# Patient Record
Sex: Male | Born: 1960 | Race: White | Hispanic: No | Marital: Married | Smoking: Former smoker
Health system: Southern US, Community
[De-identification: ages and names within clinical notes are randomized; demographics above are authoritative.]

## PROBLEM LIST (undated history)

## (undated) DIAGNOSIS — I10 Essential (primary) hypertension: Secondary | ICD-10-CM

## (undated) DIAGNOSIS — E119 Type 2 diabetes mellitus without complications: Secondary | ICD-10-CM

## (undated) DIAGNOSIS — E785 Hyperlipidemia, unspecified: Secondary | ICD-10-CM

## (undated) DIAGNOSIS — K219 Gastro-esophageal reflux disease without esophagitis: Secondary | ICD-10-CM

## (undated) HISTORY — PX: NO PAST SURGERIES: SHX2092

## (undated) HISTORY — DX: Type 2 diabetes mellitus without complications: E11.9

## (undated) HISTORY — DX: Gastro-esophageal reflux disease without esophagitis: K21.9

## (undated) HISTORY — DX: Essential (primary) hypertension: I10

## (undated) HISTORY — PX: COLONOSCOPY: SHX174

## (undated) HISTORY — DX: Hyperlipidemia, unspecified: E78.5

---

## 2005-05-21 ENCOUNTER — Ambulatory Visit: Payer: Self-pay | Admitting: Family Medicine

## 2005-08-02 ENCOUNTER — Ambulatory Visit: Payer: Self-pay | Admitting: Family Medicine

## 2007-02-17 ENCOUNTER — Ambulatory Visit: Payer: Self-pay | Admitting: Family Medicine

## 2007-03-30 ENCOUNTER — Ambulatory Visit: Payer: Self-pay | Admitting: Family Medicine

## 2007-03-30 LAB — CONVERTED CEMR LAB
AST: 21 units/L (ref 0–37)
Albumin: 4 g/dL (ref 3.5–5.2)
Alkaline Phosphatase: 54 units/L (ref 39–117)
BUN: 9 mg/dL (ref 6–23)
Basophils Absolute: 0.1 10*3/uL (ref 0.0–0.1)
Blood in Urine, dipstick: NEGATIVE
Chloride: 105 meq/L (ref 96–112)
Cholesterol: 172 mg/dL (ref 0–200)
Direct LDL: 74.6 mg/dL
Eosinophils Absolute: 0.2 10*3/uL (ref 0.0–0.6)
GFR calc non Af Amer: 96 mL/min
HCT: 42.6 % (ref 39.0–52.0)
HDL: 30.8 mg/dL — ABNORMAL LOW (ref >39.0)
Ketones, urine, test strip: NEGATIVE
MCHC: 34.2 g/dL (ref 30.0–36.0)
MCV: 93.1 fL (ref 78.0–100.0)
Monocytes Relative: 11.2 % — ABNORMAL HIGH (ref 3.0–11.0)
Neutrophils Relative %: 60.7 % (ref 43.0–77.0)
Platelets: 185 10*3/uL (ref 150–400)
RBC: 4.58 M/uL (ref 4.22–5.81)
Sodium: 145 meq/L (ref 135–145)
TSH: 1.48 microintl units/mL (ref 0.35–5.50)
Total CHOL/HDL Ratio: 5.6
Triglycerides: 366 mg/dL (ref 0–149)

## 2007-04-06 ENCOUNTER — Ambulatory Visit: Payer: Self-pay | Admitting: Family Medicine

## 2007-04-06 DIAGNOSIS — E781 Pure hyperglyceridemia: Secondary | ICD-10-CM | POA: Insufficient documentation

## 2007-07-08 ENCOUNTER — Ambulatory Visit: Payer: Self-pay | Admitting: Family Medicine

## 2007-07-08 LAB — CONVERTED CEMR LAB
Direct LDL: 121.3 mg/dL
HDL: 26.9 mg/dL — ABNORMAL LOW (ref >39.0)
Triglycerides: 446 mg/dL (ref 0–149)

## 2007-07-13 ENCOUNTER — Telehealth: Payer: Self-pay | Admitting: Family Medicine

## 2007-07-14 ENCOUNTER — Telehealth: Payer: Self-pay | Admitting: Family Medicine

## 2007-07-21 ENCOUNTER — Telehealth: Payer: Self-pay | Admitting: Family Medicine

## 2008-11-10 ENCOUNTER — Telehealth: Payer: Self-pay | Admitting: Family Medicine

## 2008-11-18 ENCOUNTER — Ambulatory Visit: Payer: Self-pay | Admitting: Family Medicine

## 2008-11-18 DIAGNOSIS — R030 Elevated blood-pressure reading, without diagnosis of hypertension: Secondary | ICD-10-CM | POA: Insufficient documentation

## 2008-11-18 LAB — CONVERTED CEMR LAB
ALT: 34 units/L (ref 0–53)
AST: 25 units/L (ref 0–37)
Blood in Urine, dipstick: NEGATIVE
Eosinophils Relative: 1.8 % (ref 0.0–5.0)
GFR calc non Af Amer: 108.83 mL/min (ref >60)
HCT: 46.1 % (ref 39.0–52.0)
HDL: 34.6 mg/dL — ABNORMAL LOW (ref >39.00)
Hemoglobin: 16 g/dL (ref 13.0–17.0)
Lymphs Abs: 2 10*3/uL (ref 0.7–4.0)
Monocytes Relative: 7.3 % (ref 3.0–12.0)
Neutro Abs: 4.2 10*3/uL (ref 1.4–7.7)
Nitrite: NEGATIVE
Potassium: 3.9 meq/L (ref 3.5–5.1)
Protein, U semiquant: NEGATIVE
Sodium: 143 meq/L (ref 135–145)
TSH: 2.17 microintl units/mL (ref 0.35–5.50)
Total Bilirubin: 0.9 mg/dL (ref 0.3–1.2)
VLDL: 103.6 mg/dL — ABNORMAL HIGH (ref 0.0–40.0)
WBC Urine, dipstick: NEGATIVE
WBC: 6.8 10*3/uL (ref 4.5–10.5)

## 2009-01-03 ENCOUNTER — Ambulatory Visit: Payer: Self-pay | Admitting: Family Medicine

## 2009-01-03 DIAGNOSIS — I10 Essential (primary) hypertension: Secondary | ICD-10-CM | POA: Insufficient documentation

## 2009-01-03 HISTORY — DX: Essential (primary) hypertension: I10

## 2009-01-18 ENCOUNTER — Ambulatory Visit: Payer: Self-pay | Admitting: Family Medicine

## 2009-01-19 LAB — CONVERTED CEMR LAB
BUN: 13 mg/dL (ref 6–23)
Chloride: 110 meq/L (ref 96–112)
Creatinine, Ser: 1.1 mg/dL (ref 0.4–1.5)
Glucose, Bld: 107 mg/dL — ABNORMAL HIGH (ref 70–99)

## 2009-02-15 ENCOUNTER — Ambulatory Visit: Payer: Self-pay | Admitting: Family Medicine

## 2009-02-15 LAB — CONVERTED CEMR LAB
Alkaline Phosphatase: 32 units/L — ABNORMAL LOW (ref 39–117)
Bilirubin, Direct: 0.1 mg/dL (ref 0.0–0.3)
Cholesterol: 186 mg/dL (ref 0–200)
LDL Cholesterol: 112 mg/dL — ABNORMAL HIGH (ref 0–99)
Total Bilirubin: 1.1 mg/dL (ref 0.3–1.2)
Total CHOL/HDL Ratio: 5
Total Protein: 7 g/dL (ref 6.0–8.3)

## 2009-02-22 ENCOUNTER — Ambulatory Visit: Payer: Self-pay | Admitting: Family Medicine

## 2010-12-14 ENCOUNTER — Other Ambulatory Visit: Payer: Self-pay | Admitting: Family Medicine

## 2011-03-21 ENCOUNTER — Other Ambulatory Visit (INDEPENDENT_AMBULATORY_CARE_PROVIDER_SITE_OTHER): Payer: BC Managed Care – PPO

## 2011-03-21 DIAGNOSIS — Z0000 Encounter for general adult medical examination without abnormal findings: Secondary | ICD-10-CM

## 2011-03-21 LAB — BASIC METABOLIC PANEL
CO2: 28 mEq/L (ref 19–32)
Calcium: 9.5 mg/dL (ref 8.4–10.5)
Chloride: 100 mEq/L (ref 96–112)
Potassium: 4.5 mEq/L (ref 3.5–5.1)
Sodium: 138 mEq/L (ref 135–145)

## 2011-03-21 LAB — CBC WITH DIFFERENTIAL/PLATELET
Basophils Relative: 0.7 % (ref 0.0–3.0)
Eosinophils Absolute: 0.2 10*3/uL (ref 0.0–0.7)
Hemoglobin: 15.7 g/dL (ref 13.0–17.0)
Lymphocytes Relative: 27.1 % (ref 12.0–46.0)
MCHC: 33.9 g/dL (ref 30.0–36.0)
Neutro Abs: 5.1 10*3/uL (ref 1.4–7.7)
RBC: 4.97 Mil/uL (ref 4.22–5.81)

## 2011-03-21 LAB — PSA: PSA: 0.31 ng/mL (ref 0.10–4.00)

## 2011-03-21 LAB — HEPATIC FUNCTION PANEL
ALT: 22 U/L (ref 0–53)
Bilirubin, Direct: 0.1 mg/dL (ref 0.0–0.3)
Total Bilirubin: 0.6 mg/dL (ref 0.3–1.2)

## 2011-03-21 LAB — POCT URINALYSIS DIPSTICK
Bilirubin, UA: NEGATIVE
Glucose, UA: NEGATIVE
Leukocytes, UA: NEGATIVE
Nitrite, UA: NEGATIVE

## 2011-03-21 LAB — LDL CHOLESTEROL, DIRECT: Direct LDL: 73.5 mg/dL

## 2011-03-21 LAB — LIPID PANEL
HDL: 34.1 mg/dL — ABNORMAL LOW (ref >39.00)
VLDL: 200.2 mg/dL — ABNORMAL HIGH (ref 0.0–40.0)

## 2011-04-16 ENCOUNTER — Ambulatory Visit (INDEPENDENT_AMBULATORY_CARE_PROVIDER_SITE_OTHER): Payer: BC Managed Care – PPO | Admitting: Family Medicine

## 2011-04-16 ENCOUNTER — Encounter: Payer: Self-pay | Admitting: Family Medicine

## 2011-04-16 DIAGNOSIS — E785 Hyperlipidemia, unspecified: Secondary | ICD-10-CM

## 2011-04-16 DIAGNOSIS — R030 Elevated blood-pressure reading, without diagnosis of hypertension: Secondary | ICD-10-CM

## 2011-04-16 DIAGNOSIS — IMO0002 Reserved for concepts with insufficient information to code with codable children: Secondary | ICD-10-CM

## 2011-04-16 DIAGNOSIS — N529 Male erectile dysfunction, unspecified: Secondary | ICD-10-CM

## 2011-04-16 DIAGNOSIS — Z0000 Encounter for general adult medical examination without abnormal findings: Secondary | ICD-10-CM

## 2011-04-16 DIAGNOSIS — E781 Pure hyperglyceridemia: Secondary | ICD-10-CM

## 2011-04-16 LAB — TESTOSTERONE: Testosterone: 215.14 ng/dL — ABNORMAL LOW (ref 350.00–890.00)

## 2011-04-16 MED ORDER — LISINOPRIL 5 MG PO TABS: 5.0000 mg | ORAL_TABLET | Freq: Every day | ORAL | Status: DC

## 2011-04-16 MED ORDER — FENOFIBRATE 145 MG PO TABS: 145.0000 mg | ORAL_TABLET | Freq: Every day | ORAL | Status: DC

## 2011-04-16 MED ORDER — SILDENAFIL CITRATE 50 MG PO TABS: 50.0000 mg | ORAL_TABLET | ORAL | Status: DC | PRN

## 2011-04-16 MED ORDER — SIMVASTATIN 40 MG PO TABS: 40.0000 mg | ORAL_TABLET | Freq: Every day | ORAL | Status: DC

## 2011-04-16 NOTE — Patient Instructions (Signed)
Restart all your medications.  Return in two months for follow-up, office visit fasting labs one week prior.  Begin Viagra 50 mg dose one half tablet one hour prior to sex.  I will call you report of your testosterone level

## 2011-04-16 NOTE — Progress Notes (Signed)
  Subjective:    Patient ID: Seth Pineda, male    DOB: Jan 19, 1961, 50 y.o.   MRN: [id number]  HPI Seth Pineda is a delightful, 50 year old, married male, nonsmoker, who works in [employer],,,,,,,, 52 year old son,,,,,,,,, only child,,,,,,,,, who comes in today for general physical examination because of history of hypertriglyceridemia and mild hypertension.  He was on a combination of an 81-mg baby aspirin, Tri-Chlor 145 mg daily omega-3 fish oil, 500 mg of Niaspan and 40 mg of simvastatin.  This controlled his to hypertriglyceridemia.  He stopped his medication in January 2012 and now lipids.  Her back up to where they were before.  Total cholesterol to 239, triglyceride level of 2000.  He is asymptomatic.  He also takes lisinopril 5 mg daily for mild, hypertension.  He's also been off of that for about 6 months.  BP 124/90.  Review of systems otherwise negative except he is now over the last year developed some symptoms of erectile dysfunction.     Review of Systems  Constitutional: Negative.   HENT: Negative.   Eyes: Negative.   Respiratory: Negative.   Cardiovascular: Negative.   Gastrointestinal: Negative.   Genitourinary: Negative.   Musculoskeletal: Negative.   Skin: Negative.   Neurological: Negative.   Hematological: Negative.   Psychiatric/Behavioral: Negative.        Objective:   Physical Exam  Constitutional: He is oriented to person, place, and time. He appears well-developed and well-nourished.  HENT:  Head: Normocephalic and atraumatic.  Right Ear: External ear normal.  Left Ear: External ear normal.  Nose: Nose normal.  Mouth/Throat: Oropharynx is clear and moist.  Eyes: Conjunctivae and EOM are normal. Pupils are equal, round, and reactive to light.  Neck: Normal range of motion. Neck supple. No JVD present. No tracheal deviation present. No thyromegaly present.  Cardiovascular: Normal rate, regular rhythm, normal heart sounds and intact distal  pulses.  Exam reveals no gallop and no friction rub.   No murmur heard. Pulmonary/Chest: Effort normal and breath sounds normal. No stridor. No respiratory distress. He has no wheezes. He has no rales. He exhibits no tenderness.  Abdominal: Soft. Bowel sounds are normal. He exhibits no distension and no mass. There is no tenderness. There is no rebound and no guarding.  Genitourinary: Rectum normal, prostate normal and penis normal. Guaiac negative stool. No penile tenderness.  Musculoskeletal: Normal range of motion. He exhibits no edema and no tenderness.  Lymphadenopathy:    He has no cervical adenopathy.  Neurological: He is alert and oriented to person, place, and time. He has normal reflexes. No cranial nerve deficit. He exhibits normal muscle tone.  Skin: Skin is warm and dry. No rash noted. No erythema. No pallor.  Psychiatric: He has a normal mood and affect. His behavior is normal. Judgment and thought content normal.          Assessment & Plan:  Healthy male.  Hypertriglyceridemia.  Restart medication.  Hypertension.  Restart medication.  Erectile dysfunction.  Check testosterone level begin Viagra.  Follow-up on the above 3 problems in two months

## 2011-04-19 ENCOUNTER — Encounter: Payer: Self-pay | Admitting: Gastroenterology

## 2011-04-30 ENCOUNTER — Other Ambulatory Visit: Payer: Self-pay | Admitting: *Deleted

## 2011-04-30 MED ORDER — TESTOSTERONE 20.25 MG/ACT (1.62%) TD GEL: 2.0000 | Freq: Every day | TRANSDERMAL | Status: DC

## 2011-04-30 NOTE — Telephone Encounter (Signed)
Message copied by Trenton Gammon on Tue Apr 30, 2011 11:25 AM ------      Message from: TODD, JEFFREY A      Created: Wed Apr 17, 2011 11:04 AM       Testosterone level is low range of normal 350 to 890,,,,,,,,,,, is 215,,,,,,,,,, supplement would be...Marland KitchenMarland KitchenMarland Kitchen AndroGel 2 pounds daily.......... Okay to call in a two-month supply follow-up here in 6 weeks

## 2011-05-31 ENCOUNTER — Ambulatory Visit (AMBULATORY_SURGERY_CENTER): Payer: BC Managed Care – PPO | Admitting: *Deleted

## 2011-05-31 ENCOUNTER — Encounter: Payer: Self-pay | Admitting: Gastroenterology

## 2011-05-31 VITALS — Ht >= 80 in | Wt 220.9 lb

## 2011-05-31 DIAGNOSIS — Z1211 Encounter for screening for malignant neoplasm of colon: Secondary | ICD-10-CM

## 2011-05-31 MED ORDER — PEG-KCL-NACL-NASULF-NA ASC-C 100 G PO SOLR: ORAL | Status: DC

## 2011-06-11 ENCOUNTER — Other Ambulatory Visit (INDEPENDENT_AMBULATORY_CARE_PROVIDER_SITE_OTHER): Payer: BC Managed Care – PPO

## 2011-06-11 DIAGNOSIS — E349 Endocrine disorder, unspecified: Secondary | ICD-10-CM

## 2011-06-11 DIAGNOSIS — E291 Testicular hypofunction: Secondary | ICD-10-CM

## 2011-06-11 DIAGNOSIS — E781 Pure hyperglyceridemia: Secondary | ICD-10-CM

## 2011-06-11 LAB — HEPATIC FUNCTION PANEL
Albumin: 4.8 g/dL (ref 3.5–5.2)
Alkaline Phosphatase: 40 U/L (ref 39–117)

## 2011-06-11 LAB — LIPID PANEL
Cholesterol: 185 mg/dL (ref 0–200)
HDL: 40.1 mg/dL (ref >39.00)
Triglycerides: 219 mg/dL — ABNORMAL HIGH (ref 0.0–149.0)
VLDL: 43.8 mg/dL — ABNORMAL HIGH (ref 0.0–40.0)

## 2011-06-14 ENCOUNTER — Other Ambulatory Visit: Payer: BC Managed Care – PPO | Admitting: Gastroenterology

## 2011-06-18 ENCOUNTER — Ambulatory Visit (INDEPENDENT_AMBULATORY_CARE_PROVIDER_SITE_OTHER): Payer: BC Managed Care – PPO | Admitting: Family Medicine

## 2011-06-18 ENCOUNTER — Encounter: Payer: Self-pay | Admitting: Family Medicine

## 2011-06-18 ENCOUNTER — Encounter: Payer: Self-pay | Admitting: Gastroenterology

## 2011-06-18 ENCOUNTER — Ambulatory Visit (AMBULATORY_SURGERY_CENTER): Payer: BC Managed Care – PPO | Admitting: Gastroenterology

## 2011-06-18 VITALS — BP 139/84 | HR 79 | Temp 98.0°F | Resp 20 | Ht >= 80 in | Wt 220.0 lb

## 2011-06-18 DIAGNOSIS — Z1211 Encounter for screening for malignant neoplasm of colon: Secondary | ICD-10-CM

## 2011-06-18 DIAGNOSIS — E785 Hyperlipidemia, unspecified: Secondary | ICD-10-CM

## 2011-06-18 MED ORDER — SODIUM CHLORIDE 0.9 % IV SOLN: 500.0000 mL | INTRAVENOUS | Status: DC

## 2011-06-18 NOTE — Patient Instructions (Signed)
Continue your current therapy.  Follow-up in one year or sooner if any problem

## 2011-06-18 NOTE — Progress Notes (Signed)
  Subjective:    Patient ID: Seth Pineda, male    DOB: 1960/10/09, 50 y.o.   MRN: [id number]  HPI  Seth Pineda is a 50 year old male, married, nonsmoker, who comes in today for follow-up of hyperlipidemia.  He is currently on a strict diet exercise is Zocor 40 mg a day Tri-Chlor hundred 45 mg a day fish oil.  Lipids have normalized triglyceride is a drop from a thousand, down to 250.  Asymptomatic.  No side effects from medication Review of Systems General review of systems negative   Objective:   Physical Exam Well-developed well-nourished man no acute distress      Assessment & Plan:  Hyperlipidemia with hypertriglyceridemia, now normalized on medication.  Continue current therapy.  Follow-up in one year, sooner if any problems

## 2011-06-18 NOTE — Progress Notes (Signed)
No complaints on discharge.maw 

## 2011-06-18 NOTE — Patient Instructions (Signed)
See the picture page for your findings from your exam today.  Follow the green and blue discharge instruction sheets the rest of the day.  Resume your prior medications today. Please call if any questions or concerns.  

## 2011-06-19 ENCOUNTER — Telehealth: Payer: Self-pay

## 2011-06-19 NOTE — Telephone Encounter (Signed)
No id on answering machine  

## 2012-09-21 ENCOUNTER — Other Ambulatory Visit: Payer: Self-pay | Admitting: Family Medicine

## 2012-09-21 NOTE — Telephone Encounter (Signed)
Left message on machine for patient to call and schedule an appointment for more refills

## 2012-09-21 NOTE — Telephone Encounter (Signed)
Pt needs refill of: fenofibrate (TRICOR) 145 MG tablet simvastatin (ZOCOR) 40 MG tablet lisinopril (PRINIVIL,ZESTRIL) 5 MG tablet sildenafil (VIAGRA) 50 MG tablet Pharm: [facility] [phone] This needs to be pt primary pharm from now on unless otherwise noted.

## 2012-10-23 ENCOUNTER — Telehealth: Payer: Self-pay | Admitting: Family Medicine

## 2012-10-23 DIAGNOSIS — IMO0002 Reserved for concepts with insufficient information to code with codable children: Secondary | ICD-10-CM

## 2012-10-23 DIAGNOSIS — E781 Pure hyperglyceridemia: Secondary | ICD-10-CM

## 2012-10-23 MED ORDER — FENOFIBRATE 145 MG PO TABS: 145.0000 mg | ORAL_TABLET | Freq: Every day | ORAL | Status: DC

## 2012-10-23 MED ORDER — SIMVASTATIN 40 MG PO TABS: 40.0000 mg | ORAL_TABLET | Freq: Every day | ORAL | Status: DC

## 2012-10-23 MED ORDER — SILDENAFIL CITRATE 50 MG PO TABS: 50.0000 mg | ORAL_TABLET | Freq: Every day | ORAL | Status: DC | PRN

## 2012-10-23 MED ORDER — LISINOPRIL 5 MG PO TABS: 5.0000 mg | ORAL_TABLET | Freq: Every day | ORAL | Status: DC

## 2012-10-23 NOTE — Telephone Encounter (Signed)
Rx sent 

## 2012-10-23 NOTE — Telephone Encounter (Signed)
Patient called stating that he need refills of his fenofibrate 145mg  1poqd, simvastatin 40 mg 1poqd at bedtime, lisinopril 5mg  1poqd, and viagra 50 mg 1poqd prn for ED sent to [facility]. Please assist.

## 2012-12-01 ENCOUNTER — Other Ambulatory Visit: Payer: BC Managed Care – PPO

## 2012-12-04 ENCOUNTER — Other Ambulatory Visit: Payer: BC Managed Care – PPO

## 2012-12-08 ENCOUNTER — Encounter: Payer: BC Managed Care – PPO | Admitting: Family Medicine

## 2012-12-23 ENCOUNTER — Other Ambulatory Visit (INDEPENDENT_AMBULATORY_CARE_PROVIDER_SITE_OTHER): Payer: BC Managed Care – PPO

## 2012-12-23 DIAGNOSIS — Z0000 Encounter for general adult medical examination without abnormal findings: Secondary | ICD-10-CM

## 2012-12-23 LAB — CBC WITH DIFFERENTIAL/PLATELET
Basophils Relative: 0.7 % (ref 0.0–3.0)
Eosinophils Absolute: 0.2 10*3/uL (ref 0.0–0.7)
HCT: 46.9 % (ref 39.0–52.0)
Hemoglobin: 16 g/dL (ref 13.0–17.0)
Lymphocytes Relative: 30.1 % (ref 12.0–46.0)
Lymphs Abs: 2.7 10*3/uL (ref 0.7–4.0)
MCHC: 34.2 g/dL (ref 30.0–36.0)
MCV: 91.1 fl (ref 78.0–100.0)
Neutro Abs: 5.1 10*3/uL (ref 1.4–7.7)
RBC: 5.15 Mil/uL (ref 4.22–5.81)

## 2012-12-23 LAB — BASIC METABOLIC PANEL
CO2: 27 mEq/L (ref 19–32)
Calcium: 9.7 mg/dL (ref 8.4–10.5)
Chloride: 105 mEq/L (ref 96–112)
Glucose, Bld: 105 mg/dL — ABNORMAL HIGH (ref 70–99)
Potassium: 5.1 mEq/L (ref 3.5–5.1)
Sodium: 141 mEq/L (ref 135–145)

## 2012-12-23 LAB — POCT URINALYSIS DIPSTICK
Ketones, UA: NEGATIVE
Leukocytes, UA: NEGATIVE
Protein, UA: NEGATIVE
Spec Grav, UA: >=1.03
pH, UA: 5.5

## 2012-12-23 LAB — HEPATIC FUNCTION PANEL
Albumin: 4.5 g/dL (ref 3.5–5.2)
Alkaline Phosphatase: 40 U/L (ref 39–117)
Total Protein: 7.1 g/dL (ref 6.0–8.3)

## 2012-12-23 LAB — TSH: TSH: 2.8 u[IU]/mL (ref 0.35–5.50)

## 2012-12-23 LAB — LIPID PANEL
Total CHOL/HDL Ratio: 6
VLDL: 65.6 mg/dL — ABNORMAL HIGH (ref 0.0–40.0)

## 2012-12-28 ENCOUNTER — Ambulatory Visit (INDEPENDENT_AMBULATORY_CARE_PROVIDER_SITE_OTHER): Payer: 59 | Admitting: Family Medicine

## 2012-12-28 ENCOUNTER — Encounter: Payer: Self-pay | Admitting: Family Medicine

## 2012-12-28 VITALS — BP 120/88 | Temp 98.8°F | Ht 78.25 in | Wt 223.0 lb

## 2012-12-28 DIAGNOSIS — N529 Male erectile dysfunction, unspecified: Secondary | ICD-10-CM

## 2012-12-28 DIAGNOSIS — IMO0002 Reserved for concepts with insufficient information to code with codable children: Secondary | ICD-10-CM

## 2012-12-28 DIAGNOSIS — E781 Pure hyperglyceridemia: Secondary | ICD-10-CM

## 2012-12-28 DIAGNOSIS — E785 Hyperlipidemia, unspecified: Secondary | ICD-10-CM

## 2012-12-28 DIAGNOSIS — Z0000 Encounter for general adult medical examination without abnormal findings: Secondary | ICD-10-CM

## 2012-12-28 MED ORDER — SILDENAFIL CITRATE 50 MG PO TABS: 50.0000 mg | ORAL_TABLET | Freq: Every day | ORAL | Status: DC | PRN

## 2012-12-28 MED ORDER — FENOFIBRATE 145 MG PO TABS: ORAL_TABLET | ORAL | Status: DC

## 2012-12-28 MED ORDER — SIMVASTATIN 40 MG PO TABS: 40.0000 mg | ORAL_TABLET | Freq: Every day | ORAL | Status: DC

## 2012-12-28 MED ORDER — NIACIN ER (ANTIHYPERLIPIDEMIC) 500 MG PO TBCR: 500.0000 mg | EXTENDED_RELEASE_TABLET | Freq: Every day | ORAL | Status: DC

## 2012-12-28 MED ORDER — LISINOPRIL 5 MG PO TABS: 5.0000 mg | ORAL_TABLET | Freq: Every day | ORAL | Status: DC

## 2012-12-28 NOTE — Patient Instructions (Signed)
Continue your current medications except increase the TriCor to 2 tabs daily  Return in 3 months for followup  Fasting labs one week prior
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
## 2013-02-02 ENCOUNTER — Encounter: Payer: Self-pay | Admitting: Family Medicine

## 2013-02-03 ENCOUNTER — Other Ambulatory Visit: Payer: BC Managed Care – PPO

## 2013-02-04 ENCOUNTER — Other Ambulatory Visit: Payer: Self-pay | Admitting: Family Medicine

## 2013-02-09 ENCOUNTER — Encounter: Payer: BC Managed Care – PPO | Admitting: Family Medicine

## 2013-03-26 ENCOUNTER — Other Ambulatory Visit (INDEPENDENT_AMBULATORY_CARE_PROVIDER_SITE_OTHER): Payer: 59

## 2013-03-26 DIAGNOSIS — E785 Hyperlipidemia, unspecified: Secondary | ICD-10-CM

## 2013-03-26 LAB — HEPATIC FUNCTION PANEL
ALT: 26 U/L (ref 0–53)
Bilirubin, Direct: 0.1 mg/dL (ref 0.0–0.3)
Total Protein: 7.2 g/dL (ref 6.0–8.3)

## 2013-03-26 LAB — LIPID PANEL
HDL: 35.2 mg/dL — ABNORMAL LOW (ref >39.00)
Triglycerides: 219 mg/dL — ABNORMAL HIGH (ref 0.0–149.0)

## 2013-05-12 ENCOUNTER — Other Ambulatory Visit: Payer: Self-pay | Admitting: Family Medicine

## 2013-11-17 ENCOUNTER — Other Ambulatory Visit: Payer: Self-pay | Admitting: Family Medicine

## 2014-01-31 ENCOUNTER — Ambulatory Visit (INDEPENDENT_AMBULATORY_CARE_PROVIDER_SITE_OTHER): Payer: 59 | Admitting: Family Medicine

## 2014-01-31 ENCOUNTER — Ambulatory Visit (INDEPENDENT_AMBULATORY_CARE_PROVIDER_SITE_OTHER)
Admission: RE | Admit: 2014-01-31 | Discharge: 2014-01-31 | Disposition: A | Discharge status: Home / Self Care | Payer: 59 | Source: Ambulatory Visit | Attending: Family Medicine | Admitting: Family Medicine

## 2014-01-31 ENCOUNTER — Encounter: Payer: Self-pay | Admitting: Family Medicine

## 2014-01-31 VITALS — BP 130/98 | Temp 98.2°F | Wt 222.0 lb

## 2014-01-31 DIAGNOSIS — M5441 Lumbago with sciatica, right side: Secondary | ICD-10-CM

## 2014-01-31 DIAGNOSIS — R1013 Epigastric pain: Secondary | ICD-10-CM | POA: Insufficient documentation

## 2014-01-31 DIAGNOSIS — M5430 Sciatica, unspecified side: Secondary | ICD-10-CM

## 2014-01-31 MED ORDER — CYCLOBENZAPRINE HCL 5 MG PO TABS: ORAL_TABLET | ORAL | Status: DC

## 2014-01-31 MED ORDER — TRAMADOL HCL 50 MG PO TABS: ORAL_TABLET | ORAL | Status: DC

## 2014-01-31 NOTE — Patient Instructions (Signed)
Go to the main office now for your back x-rays  Flexeril and tramadol............ one of each at bedtime for low back pain  Avoid caffeine alcohol peppermint................ Prilosec OTC......... one twice daily for 2 weeks. If after 2 weeks her discomfort persists call and we will pursue it

## 2014-01-31 NOTE — Progress Notes (Signed)
   Subjective:    Patient ID: Seth Pineda, male    DOB: 01/22/1961, 53 y.o.   MRN: [id number]  HPI Seth Pineda is a 53 year old married male nonsmoker who comes in today for evaluation of 2 problems  For a month he's had midepigastric pain that comes and goes. He says the last about 20 minutes and go away. It's mild 3 on a scale of 1-10 and feels like a dull ache. It does not radiate. It is not associated with eating. His father had his gallbladder removed in his 29s. No history of trauma  No history of difficulty swallowing or change in bowel habits  The second problem is right lower back pain. It comes and goes for the last couple months a week ago it got extremely severe. He describes the pain at 10 on a scale of 1-10 when it's at its worse. It's a dull and he points to the right lumbar as a source of his pain. No history of trauma. No history of any bowel or bladder dysfunction  He has no fever chills weight loss or pain at night.   Review of Systems    review of systems otherwise negative Objective:   Physical Exam  Well-developed well-nourished male no acute distress vital signs stable he is afebrile in the supine position his right leg is a quarter inch shorter than the left  Sensation muscle strength reflexes straight leg raising all within normal limits  Abdominal exam,,,,,,,, abdomen is flat bowel sounds are normal no tenderness liver spleen and kidneys not enlarged no palpable masses      Assessment & Plan:  Lumbar back pain,,,,,,,,,,, x-ray spine ,,,,,,, treat symptomatically  Midepigastric abdominal pain ,,,,,,,,, avoid caffeine ,,,,,,,,, Prilosec twice daily for 2 weeks if symptoms persist workup GI H. pylori etc. ,,,,,,,,

## 2014-01-31 NOTE — Progress Notes (Signed)
Pre visit review using our clinic review tool, if applicable. No additional management support is needed unless otherwise documented below in the visit note. 

## 2014-02-04 ENCOUNTER — Telehealth: Payer: Self-pay | Admitting: Family Medicine

## 2014-02-04 DIAGNOSIS — M5441 Lumbago with sciatica, right side: Secondary | ICD-10-CM

## 2014-02-04 MED ORDER — CYCLOBENZAPRINE HCL 5 MG PO TABS: ORAL_TABLET | ORAL | Status: DC

## 2014-02-04 MED ORDER — TRAMADOL HCL 50 MG PO TABS: ORAL_TABLET | ORAL | Status: DC

## 2014-02-04 NOTE — Telephone Encounter (Signed)
Pt is needing new rx for cyclobenzaprine (FLEXERIL) 5 MG tablet, and tramadol (ultram) 50 mg, pt states he can not get the rx filled based on how it was written and needs the [facility] to [facility] sent  [facility]

## 2014-02-04 NOTE — Telephone Encounter (Signed)
Prescriptions have been sent/faxed to [facility]

## 2014-05-27 ENCOUNTER — Other Ambulatory Visit: Payer: Self-pay | Admitting: Family Medicine

## 2015-05-26 ENCOUNTER — Telehealth: Payer: Self-pay | Admitting: Gastroenterology

## 2015-05-29 ENCOUNTER — Telehealth: Payer: Self-pay | Admitting: Family Medicine

## 2015-05-29 NOTE — Telephone Encounter (Signed)
Pt has lower back pain and states he needs appt tomorrow, hopefully dr todd pls advise if ok to schedule on tues

## 2015-05-29 NOTE — Telephone Encounter (Signed)
Patient c/o abdominal pain on Friday.  He states that the pain is now gone. He is now having some back pain, but no longer having abdominal pain.  He will call back if the problem returns

## 2015-05-29 NOTE — Telephone Encounter (Signed)
Per MD Tawanna Cooler . Okay to schedule for tomorrow.

## 2015-05-30 ENCOUNTER — Ambulatory Visit (INDEPENDENT_AMBULATORY_CARE_PROVIDER_SITE_OTHER): Payer: 59 | Admitting: Family Medicine

## 2015-05-30 VITALS — BP 122/90 | HR 84 | Temp 98.2°F | Ht 78.25 in | Wt 215.6 lb

## 2015-05-30 DIAGNOSIS — I10 Essential (primary) hypertension: Secondary | ICD-10-CM | POA: Diagnosis not present

## 2015-05-30 DIAGNOSIS — E785 Hyperlipidemia, unspecified: Secondary | ICD-10-CM

## 2015-05-30 DIAGNOSIS — Z23 Encounter for immunization: Secondary | ICD-10-CM

## 2015-05-30 DIAGNOSIS — M5441 Lumbago with sciatica, right side: Secondary | ICD-10-CM

## 2015-05-30 DIAGNOSIS — M5442 Lumbago with sciatica, left side: Secondary | ICD-10-CM

## 2015-05-30 MED ORDER — FENOFIBRATE 145 MG PO TABS: 145.0000 mg | ORAL_TABLET | Freq: Every day | ORAL | Status: DC

## 2015-05-30 MED ORDER — NIACIN ER (ANTIHYPERLIPIDEMIC) 500 MG PO TBCR: 500.0000 mg | EXTENDED_RELEASE_TABLET | Freq: Every day | ORAL | Status: DC

## 2015-05-30 MED ORDER — INFLUENZA VAC SPLIT QUAD 0.5 ML IM SUSY
0.5000 mL | PREFILLED_SYRINGE | Freq: Once | INTRAMUSCULAR | Status: AC
  Administered 2015-05-30: 0.5 mL via INTRAMUSCULAR

## 2015-05-30 MED ORDER — CYCLOBENZAPRINE HCL 5 MG PO TABS: ORAL_TABLET | ORAL | Status: DC

## 2015-05-30 MED ORDER — SIMVASTATIN 40 MG PO TABS: 40.0000 mg | ORAL_TABLET | Freq: Every day | ORAL | Status: DC

## 2015-05-30 MED ORDER — LISINOPRIL 5 MG PO TABS: 5.0000 mg | ORAL_TABLET | Freq: Every day | ORAL | Status: DC

## 2015-05-30 NOTE — Progress Notes (Signed)
   Subjective:    Patient ID: Seth Pineda, male    DOB: April 15, 1961, 54 y.o.   MRN: [id number]  HPI Seth Pineda is a 54 year old married male nonsmoker who comes in today for evaluation of chronic recurrent low back pain  His low back pain started about 4 years ago. It tends to be left-sided it tends to be of sudden onset. It's sharp on a scale of 1-10 its a 10. He points the left lumbar area as a source of his pain. It does not radiate. He's been treating the past with he Flexeril and tramadol in bed rest. Over the last week he's had recurrent episodes of pain all of which were relieved by 6-8 hours of bed rest. Today his pain is pretty much gone. He denies any bowel or bladder dysfunction except he said some constipation and cramping. He call Dr. Christella Pineda last Friday and Dr. Christella Pineda returned his call on Monday. By that time his GI problems it pretty much abated. He states he had a colonoscopy years ago which was normal. This was done in 2012.   Review of Systems Review of systems otherwise negative    Objective:   Physical Exam Well-developed well-nourished male no acute distress vital signs stable he is afebrile  He is 6 foot 6 inches tall.  In the supine position both legs reveal equal length. 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 continue conservative therapy at home when he has a flare

## 2015-05-30 NOTE — Patient Instructions (Signed)
We will set you up a physical therapy consult to address your low back pain  Continue your current therapy as you're doing when you have a flare  Set up a time sometime in the next couple months for general physical exam  Fasting labs one week prior

## 2015-05-30 NOTE — Progress Notes (Signed)
Pre visit review using our clinic review tool, if applicable. No additional management support is needed unless otherwise documented below in the visit note. 

## 2015-10-04 ENCOUNTER — Other Ambulatory Visit (INDEPENDENT_AMBULATORY_CARE_PROVIDER_SITE_OTHER): Payer: 59

## 2015-10-04 DIAGNOSIS — Z0000 Encounter for general adult medical examination without abnormal findings: Secondary | ICD-10-CM

## 2015-10-04 LAB — LIPID PANEL
CHOL/HDL RATIO: 5
Cholesterol: 193 mg/dL (ref 0–200)
HDL: 38 mg/dL — AB (ref >39.00)
LDL CALC: 123 mg/dL — AB (ref 0–99)
NONHDL: 155.45
Triglycerides: 163 mg/dL — ABNORMAL HIGH (ref 0.0–149.0)
VLDL: 32.6 mg/dL (ref 0.0–40.0)

## 2015-10-04 LAB — POC URINALSYSI DIPSTICK (AUTOMATED)
BILIRUBIN UA: NEGATIVE
Blood, UA: NEGATIVE
GLUCOSE UA: NEGATIVE
KETONES UA: NEGATIVE
LEUKOCYTES UA: NEGATIVE
Nitrite, UA: NEGATIVE
PROTEIN UA: NEGATIVE
SPEC GRAV UA: 1.025
Urobilinogen, UA: 0.2
pH, UA: 5.5

## 2015-10-04 LAB — CBC WITH DIFFERENTIAL/PLATELET
BASOS PCT: 0.9 % (ref 0.0–3.0)
Basophils Absolute: 0.1 10*3/uL (ref 0.0–0.1)
EOS ABS: 0.5 10*3/uL (ref 0.0–0.7)
EOS PCT: 6.1 % — AB (ref 0.0–5.0)
HEMATOCRIT: 47.2 % (ref 39.0–52.0)
HEMOGLOBIN: 15.4 g/dL (ref 13.0–17.0)
LYMPHS PCT: 30.9 % (ref 12.0–46.0)
Lymphs Abs: 2.4 10*3/uL (ref 0.7–4.0)
MCHC: 32.7 g/dL (ref 30.0–36.0)
MCV: 92.3 fl (ref 78.0–100.0)
MONO ABS: 0.7 10*3/uL (ref 0.1–1.0)
Monocytes Relative: 8.4 % (ref 3.0–12.0)
Neutro Abs: 4.2 10*3/uL (ref 1.4–7.7)
Neutrophils Relative %: 53.7 % (ref 43.0–77.0)
Platelets: 233 10*3/uL (ref 150.0–400.0)
RBC: 5.11 Mil/uL (ref 4.22–5.81)
RDW: 14.1 % (ref 11.5–15.5)
WBC: 7.8 10*3/uL (ref 4.0–10.5)

## 2015-10-04 LAB — TSH: TSH: 1.69 u[IU]/mL (ref 0.35–4.50)

## 2015-10-04 LAB — BASIC METABOLIC PANEL
BUN: 12 mg/dL (ref 6–23)
CHLORIDE: 106 meq/L (ref 96–112)
CO2: 28 mEq/L (ref 19–32)
Calcium: 9.8 mg/dL (ref 8.4–10.5)
Creatinine, Ser: 0.94 mg/dL (ref 0.40–1.50)
GFR: 88.01 mL/min (ref >60.00)
Glucose, Bld: 118 mg/dL — ABNORMAL HIGH (ref 70–99)
POTASSIUM: 4.5 meq/L (ref 3.5–5.1)
Sodium: 143 mEq/L (ref 135–145)

## 2015-10-04 LAB — HEPATIC FUNCTION PANEL
ALT: 21 U/L (ref 0–53)
AST: 18 U/L (ref 0–37)
Albumin: 4.6 g/dL (ref 3.5–5.2)
Alkaline Phosphatase: 38 U/L — ABNORMAL LOW (ref 39–117)
BILIRUBIN DIRECT: 0.1 mg/dL (ref 0.0–0.3)
BILIRUBIN TOTAL: 0.5 mg/dL (ref 0.2–1.2)
TOTAL PROTEIN: 7 g/dL (ref 6.0–8.3)

## 2015-10-04 LAB — PSA: PSA: 0.27 ng/mL (ref 0.10–4.00)

## 2015-10-10 ENCOUNTER — Ambulatory Visit (INDEPENDENT_AMBULATORY_CARE_PROVIDER_SITE_OTHER): Payer: 59 | Admitting: Family Medicine

## 2015-10-10 ENCOUNTER — Encounter: Payer: Self-pay | Admitting: Family Medicine

## 2015-10-10 VITALS — BP 120/84 | Temp 98.3°F | Ht 79.75 in | Wt 221.0 lb

## 2015-10-10 DIAGNOSIS — Z0000 Encounter for general adult medical examination without abnormal findings: Secondary | ICD-10-CM

## 2015-10-10 DIAGNOSIS — E785 Hyperlipidemia, unspecified: Secondary | ICD-10-CM | POA: Diagnosis not present

## 2015-10-10 DIAGNOSIS — I10 Essential (primary) hypertension: Secondary | ICD-10-CM

## 2015-10-10 DIAGNOSIS — E781 Pure hyperglyceridemia: Secondary | ICD-10-CM

## 2015-10-10 MED ORDER — SILDENAFIL CITRATE 20 MG PO TABS: 20.0000 mg | ORAL_TABLET | Freq: Three times a day (TID) | ORAL | Status: DC

## 2015-10-10 MED ORDER — FENOFIBRATE 145 MG PO TABS: ORAL_TABLET | ORAL | Status: DC

## 2015-10-10 MED ORDER — LISINOPRIL 5 MG PO TABS: 5.0000 mg | ORAL_TABLET | Freq: Every day | ORAL | Status: DC

## 2015-10-10 MED ORDER — SIMVASTATIN 40 MG PO TABS: 40.0000 mg | ORAL_TABLET | Freq: Every day | ORAL | Status: DC

## 2015-10-10 NOTE — Patient Instructions (Signed)
Continue current medications  Continue diet and exercise program  Return in one year for general physical exam sooner if any problems,,,,,,,,, Kandee Keen or Raynelle Fanning are 2 new adult nurse practitioners,,,,,,,,, or Dr. [place]

## 2015-10-10 NOTE — Progress Notes (Signed)
Pre visit review using our clinic review tool, if applicable. No additional management support is needed unless otherwise documented below in the visit note. 

## 2015-10-10 NOTE — Progress Notes (Signed)
   Subjective:    Patient ID: Seth Pineda, male    DOB: [birth year], 55 y.o.   MRN: [id number]  HPI [name] is a 55 year old married male nonsmoker who comes in today for general physical examination because of a history of hyperlipidemia and hypertension  His hyperlipidemia is managed with TriCor 2 tabs daily along with Zocor to 40 mg daily.  He's on lisinopril 5 mg daily with good blood pressure control 120/84  He uses Viagra when necessary for ED and occasionally will take a Flexeril and tramadol bedtime when necessary for chronic low back pain when he has recurrence  He gets routine eye care, dental care, colonoscopy 2012 was normal  Vaccinations up-to-date  Genetic history mother had a prophylactic mastectomy because her 4 sisters all had breast cancer. His father had a heart attack around age 15 had a couple stents he's doing fine. 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 3 brothers all in good health  Social history is married recently got a biopsy from his [employer]. He is in the process now of looking for a new job    Review of Systems  Constitutional: Negative.   HENT: Negative.   Eyes: Negative.   Respiratory: Negative.   Cardiovascular: Negative.   Gastrointestinal: Negative.   Endocrine: Negative.   Genitourinary: Negative.   Musculoskeletal: Negative.   Skin: Negative.   Allergic/Immunologic: Negative.   Neurological: Negative.   Hematological: Negative.   Psychiatric/Behavioral: Negative.        Objective:   Physical Exam  Constitutional: He is oriented to person, place, and time. He appears well-developed and well-nourished.  HENT:  Head: Normocephalic and atraumatic.  Right Ear: External ear normal.  Left Ear: External ear normal.  Nose: Nose normal.  Mouth/Throat: Oropharynx is clear and moist.  Eyes: Conjunctivae and EOM are normal. Pupils are equal, round, and reactive to  light.  Neck: Normal range of motion. Neck supple. No JVD present. No tracheal deviation present. No thyromegaly present.  Cardiovascular: Normal rate, regular rhythm, normal heart sounds and intact distal pulses.  Exam reveals no gallop and no friction rub.   No murmur heard. No carotid nor aortic bruits peripheral pulses 2+ and symmetrical  Pulmonary/Chest: Effort normal and breath sounds normal. No stridor. No respiratory distress. He has no wheezes. He has no rales. He exhibits no tenderness.  Abdominal: Soft. Bowel sounds are normal. He exhibits no distension and no mass. There is no tenderness. There is no rebound and no guarding.  Genitourinary: Rectum normal, prostate normal and penis normal. Guaiac negative stool. No penile tenderness.  Musculoskeletal: Normal range of motion. He exhibits no edema or tenderness.  Lymphadenopathy:    He has no cervical adenopathy.  Neurological: He is alert and oriented to person, place, and time. He has normal reflexes. No cranial nerve deficit. He exhibits normal muscle tone.  Skin: Skin is warm and dry. No rash noted. No erythema. No pallor.  Psychiatric: He has a normal mood and affect. His behavior is normal. Judgment and thought content normal.  Nursing note and vitals reviewed.         Assessment & Plan:  Healthy male  History of hyperlipidemia controlled with TriCor and Zocor........ continued  Hypertension controlled with lisinopril 5 mg daily  ED.......... trial of revatio

## 2017-04-23 ENCOUNTER — Telehealth: Payer: Self-pay | Admitting: Family Medicine

## 2017-04-23 NOTE — Telephone Encounter (Signed)
Correction, patient was actually referred by a physician.  Please schedule a new patient appointment

## 2017-04-23 NOTE — Telephone Encounter (Signed)
Called pt and made him aware.

## 2017-04-23 NOTE — Telephone Encounter (Signed)
Pt would like to establish care with Dr. Drue Novel.    Made pt aware that provider is not accepting np   He asked that I ask anyway.    Dr. Drue Novel will you take this pt on as a new pt?

## 2017-04-23 NOTE — Telephone Encounter (Signed)
Unfortunately unable to take new pt at  this point

## 2017-04-24 NOTE — Telephone Encounter (Signed)
Pt has been scheduled.  °

## 2017-05-23 ENCOUNTER — Encounter: Payer: Self-pay | Admitting: Family Medicine

## 2017-06-02 ENCOUNTER — Telehealth: Payer: Self-pay

## 2017-06-02 NOTE — Telephone Encounter (Signed)
Pre visit call attempted. Left message for return call. 

## 2017-06-03 ENCOUNTER — Encounter: Payer: Self-pay | Admitting: Internal Medicine

## 2017-06-03 ENCOUNTER — Ambulatory Visit (INDEPENDENT_AMBULATORY_CARE_PROVIDER_SITE_OTHER): Payer: BLUE CROSS/BLUE SHIELD | Admitting: Internal Medicine

## 2017-06-03 VITALS — BP 122/70 | HR 55 | Temp 98.1°F | Resp 14 | Ht 79.75 in | Wt 221.1 lb

## 2017-06-03 DIAGNOSIS — R1310 Dysphagia, unspecified: Secondary | ICD-10-CM

## 2017-06-03 DIAGNOSIS — Z23 Encounter for immunization: Secondary | ICD-10-CM | POA: Diagnosis not present

## 2017-06-03 DIAGNOSIS — I10 Essential (primary) hypertension: Secondary | ICD-10-CM | POA: Diagnosis not present

## 2017-06-03 DIAGNOSIS — R739 Hyperglycemia, unspecified: Secondary | ICD-10-CM | POA: Diagnosis not present

## 2017-06-03 DIAGNOSIS — Z1159 Encounter for screening for other viral diseases: Secondary | ICD-10-CM

## 2017-06-03 DIAGNOSIS — Z114 Encounter for screening for human immunodeficiency virus [HIV]: Secondary | ICD-10-CM

## 2017-06-03 DIAGNOSIS — E781 Pure hyperglyceridemia: Secondary | ICD-10-CM | POA: Diagnosis not present

## 2017-06-03 DIAGNOSIS — Z7689 Persons encountering health services in other specified circumstances: Secondary | ICD-10-CM

## 2017-06-03 MED ORDER — LISINOPRIL 5 MG PO TABS: 5.0000 mg | ORAL_TABLET | Freq: Every day | ORAL | 1 refills | Status: DC

## 2017-06-03 MED ORDER — SIMVASTATIN 40 MG PO TABS: 40.0000 mg | ORAL_TABLET | Freq: Every day | ORAL | 1 refills | Status: DC

## 2017-06-03 NOTE — Telephone Encounter (Signed)
Noted  

## 2017-06-03 NOTE — Patient Instructions (Signed)
  GO TO THE FRONT DESK Schedule labs to be done this week, fasting Schedule your next appointment for a      Check the  blood pressure   Weekly  Be sure your blood pressure is between 110/65 and  135/85. If it is consistently higher or lower, let me know

## 2017-06-03 NOTE — Progress Notes (Signed)
Pre visit review using our clinic review tool, if applicable. No additional management support is needed unless otherwise documented below in the visit note. 

## 2017-06-03 NOTE — Progress Notes (Signed)
Subjective:    Patient ID: Seth Pineda, male    DOB: 1961/05/15, 56 y.o.   MRN: [id number]  DOS:  06/03/2017 Type of visit - description : transferring from Dr Tawanna Cooler, new to provider  Interval history: Chart reviewed. HTN: Good compliance of medication, check BPs occasionally and sometimes is high. High cholesterol: On simvastatin, used to be  on fenofibrate but he stopped, Apparently not covered by insurance. History of dysphagia for the last 2 years, used to be a rare recurrence but lately has increased to once weekly. Usually with solids but sometimes with liquids.  Review of Systems Denies nausea, vomiting, diarrhea. No weight loss No chest pain or difficulty breathing  Past Medical History:  Diagnosis Date  . Hyperlipidemia   . Hypertension     Past Surgical History:  Procedure Laterality Date  . NO PAST SURGERIES      Social History   Social History  . Marital status: Married    Spouse name: N/A  . Number of children: N/A  . Years of education: N/A   Occupational History  . Not on file.   Social History Main Topics  . Smoking status: Former Smoker    Quit date: 05/31/1999  . Smokeless tobacco: Never Used  . Alcohol use Yes     Comment: occasional beer  . Drug use: No  . Sexual activity: Not on file   Other Topics Concern  . Not on file   Social History Narrative  . No narrative on file      Allergies as of 06/03/2017   No Known Allergies     Medication List       Accurate as of 06/03/17  4:12 PM. Always use your most recent med list.          aspirin 81 MG tablet Take 81 mg by mouth daily. Takes 3 tablets daily   cyclobenzaprine 5 MG tablet Commonly known as:  FLEXERIL One tablet at bedtime   famotidine 10 MG tablet Commonly known as:  PEPCID Take 10 mg by mouth 2 (two) times daily as needed for heartburn or indigestion.   fenofibrate 145 MG tablet Commonly known as:  TRICOR 2 tabs daily   fish oil-omega-3 fatty acids 1000 MG  capsule Take 2 g by mouth daily.   lisinopril 5 MG tablet Commonly known as:  PRINIVIL,ZESTRIL Take 1 tablet (5 mg total) by mouth daily.   sildenafil 20 MG tablet Commonly known as:  REVATIO Take 1 tablet (20 mg total) by mouth 3 (three) times daily.   simvastatin 40 MG tablet Commonly known as:  ZOCOR Take 1 tablet (40 mg total) by mouth at bedtime.   traMADol 50 MG tablet Commonly known as:  ULTRAM One tablet at bedtime for back pain          Objective:   Physical Exam BP 122/70 (BP Location: Left Arm, Patient Position: Sitting, Cuff Size: Small)   Pulse (!) 55   Temp 98.1 F (36.7 C) (Oral)   Resp 14   Ht 6' 7.75" (2.026 m)   Wt 221 lb 2 oz (100.3 kg)   SpO2 98%   BMI 24.44 kg/m  General:   Well developed, well nourished . NAD.  HEENT:  Normocephalic . Face symmetric, atraumatic. Neck: No mass, supraclavicular area is normal Lungs:  CTA B Normal respiratory effort, no intercostal retractions, no accessory muscle use. Heart: RRR,  no murmur.  no pretibial edema bilaterally  Abdomen:  Not distended, soft, non-tender.  No rebound or rigidity.  Skin: Not pale. Not jaundice Neurologic:  alert & oriented X3.  Speech normal, gait appropriate for age and unassisted Psych--  Cognition and judgment appear intact.  Cooperative with normal attention span and concentration.  Behavior appropriate. No anxious or depressed appearing.    Assessment & Plan:   Assessment HTN dx in his 54 Dyslipidemia Back pain  E.D. h/o low testosterone 2012  Plan: New patient, transferring from Dr. Tawanna Cooler HTN: BP today is very good, occasional high readings at home, continue lisinopril, monitoring BPs, check the CMP and CBC. Dyslipidemia: On simvastatin, used to take fenofibrate, in 2012 triglycerides were 1000. Check a FLP, consider restart fenofibrate. Back pain: Episodic , reassess prn Dysphagia: Started 2 years ago, he is 56 y/o, not a smoker. Given age and relatively new  onset refer to GI. Primary care: Negative colonoscopy 2012, next 10 years. Flu shot today. Screening for hep C and HIV. Mild hyperglycemia noted, check A1c RTC NPO for labs, RTC  6 months, see instructions

## 2017-06-03 NOTE — Telephone Encounter (Signed)
Patient returning call, states he will handle at appointment today. Not sure he will be reachable later

## 2017-06-04 DIAGNOSIS — Z09 Encounter for follow-up examination after completed treatment for conditions other than malignant neoplasm: Secondary | ICD-10-CM | POA: Insufficient documentation

## 2017-06-04 NOTE — Assessment & Plan Note (Signed)
New patient, transferring from Dr. Tawanna Cooler HTN: BP today is very good, occasional high readings at home, continue lisinopril, monitoring BPs, check the CMP and CBC. Dyslipidemia: On simvastatin, used to take fenofibrate, in 2012 triglycerides were 1000. Check a FLP, consider restart fenofibrate. Back pain: Episodic , reassess prn Dysphagia: Started 2 years ago, he is 56 y/o, not a smoker. Given age and relatively new onset refer to GI. Primary care: Negative colonoscopy 2012, next 10 years. Flu shot today. Screening for hep C and HIV. Mild hyperglycemia noted, check A1c RTC NPO for labs, RTC  6 months, see instructions

## 2017-06-06 ENCOUNTER — Other Ambulatory Visit (INDEPENDENT_AMBULATORY_CARE_PROVIDER_SITE_OTHER): Payer: BLUE CROSS/BLUE SHIELD

## 2017-06-06 DIAGNOSIS — I10 Essential (primary) hypertension: Secondary | ICD-10-CM | POA: Diagnosis not present

## 2017-06-06 DIAGNOSIS — R739 Hyperglycemia, unspecified: Secondary | ICD-10-CM

## 2017-06-06 DIAGNOSIS — Z0000 Encounter for general adult medical examination without abnormal findings: Secondary | ICD-10-CM

## 2017-06-06 DIAGNOSIS — Z114 Encounter for screening for human immunodeficiency virus [HIV]: Secondary | ICD-10-CM

## 2017-06-06 DIAGNOSIS — E781 Pure hyperglyceridemia: Secondary | ICD-10-CM | POA: Diagnosis not present

## 2017-06-06 DIAGNOSIS — Z1159 Encounter for screening for other viral diseases: Secondary | ICD-10-CM

## 2017-06-06 LAB — CBC WITH DIFFERENTIAL/PLATELET
Basophils Absolute: 0.1 10*3/uL (ref 0.0–0.1)
Basophils Relative: 1.1 % (ref 0.0–3.0)
EOS PCT: 3.5 % (ref 0.0–5.0)
Eosinophils Absolute: 0.3 10*3/uL (ref 0.0–0.7)
HCT: 47.7 % (ref 39.0–52.0)
Hemoglobin: 16.1 g/dL (ref 13.0–17.0)
LYMPHS ABS: 2.6 10*3/uL (ref 0.7–4.0)
Lymphocytes Relative: 31.4 % (ref 12.0–46.0)
MCHC: 33.8 g/dL (ref 30.0–36.0)
MCV: 91.9 fl (ref 78.0–100.0)
MONOS PCT: 8.4 % (ref 3.0–12.0)
Monocytes Absolute: 0.7 10*3/uL (ref 0.1–1.0)
NEUTROS ABS: 4.7 10*3/uL (ref 1.4–7.7)
NEUTROS PCT: 55.6 % (ref 43.0–77.0)
PLATELETS: 234 10*3/uL (ref 150.0–400.0)
RBC: 5.19 Mil/uL (ref 4.22–5.81)
RDW: 14 % (ref 11.5–15.5)
WBC: 8.4 10*3/uL (ref 4.0–10.5)

## 2017-06-06 LAB — COMPREHENSIVE METABOLIC PANEL
ALK PHOS: 53 U/L (ref 39–117)
ALT: 22 U/L (ref 0–53)
AST: 17 U/L (ref 0–37)
Albumin: 4.2 g/dL (ref 3.5–5.2)
BILIRUBIN TOTAL: 0.5 mg/dL (ref 0.2–1.2)
BUN: 14 mg/dL (ref 6–23)
CO2: 25 mEq/L (ref 19–32)
Calcium: 9.5 mg/dL (ref 8.4–10.5)
Chloride: 105 mEq/L (ref 96–112)
Creatinine, Ser: 0.82 mg/dL (ref 0.40–1.50)
GFR: 102.43 mL/min (ref >60.00)
GLUCOSE: 121 mg/dL — AB (ref 70–99)
Potassium: 3.7 mEq/L (ref 3.5–5.1)
Sodium: 140 mEq/L (ref 135–145)
TOTAL PROTEIN: 7 g/dL (ref 6.0–8.3)

## 2017-06-06 LAB — LDL CHOLESTEROL, DIRECT: Direct LDL: 90 mg/dL

## 2017-06-06 LAB — LIPID PANEL
Cholesterol: 202 mg/dL — ABNORMAL HIGH (ref 0–200)
HDL: 31.2 mg/dL — AB (ref >39.00)
Total CHOL/HDL Ratio: 6
Triglycerides: 634 mg/dL — ABNORMAL HIGH (ref 0.0–149.0)

## 2017-06-06 LAB — HEMOGLOBIN A1C: Hgb A1c MFr Bld: 5.6 % (ref 4.6–6.5)

## 2017-06-07 LAB — HEPATITIS C ANTIBODY
HEP C AB: NONREACTIVE
SIGNAL TO CUT-OFF: 0.01 (ref <1.00)

## 2017-06-07 LAB — HIV ANTIBODY (ROUTINE TESTING W REFLEX): HIV 1&2 Ab, 4th Generation: NONREACTIVE

## 2017-06-09 MED ORDER — FENOFIBRATE 160 MG PO TABS: 160.0000 mg | ORAL_TABLET | Freq: Every day | ORAL | 1 refills | Status: DC

## 2017-06-09 NOTE — Addendum Note (Signed)
Addended byConrad [place] D on: 06/09/2017 09:44 AM   Modules accepted: Orders

## 2017-06-12 ENCOUNTER — Encounter: Payer: Self-pay | Admitting: Gastroenterology

## 2017-06-16 MED FILL — FENOFIBRATE 160 MG TABLET: 160 | 90 days supply | Qty: 90 | Fill #0

## 2017-08-12 ENCOUNTER — Ambulatory Visit: Payer: BLUE CROSS/BLUE SHIELD | Admitting: Gastroenterology

## 2017-09-04 ENCOUNTER — Encounter: Payer: Self-pay | Admitting: Gastroenterology

## 2017-09-04 ENCOUNTER — Ambulatory Visit (INDEPENDENT_AMBULATORY_CARE_PROVIDER_SITE_OTHER): Payer: BLUE CROSS/BLUE SHIELD | Admitting: Gastroenterology

## 2017-09-04 VITALS — BP 124/80 | HR 66 | Ht 79.75 in | Wt 216.8 lb

## 2017-09-04 DIAGNOSIS — K219 Gastro-esophageal reflux disease without esophagitis: Secondary | ICD-10-CM | POA: Diagnosis not present

## 2017-09-04 DIAGNOSIS — R1310 Dysphagia, unspecified: Secondary | ICD-10-CM | POA: Diagnosis not present

## 2017-09-04 NOTE — Progress Notes (Signed)
09/04/2017 Seth Pineda [id number] [birth year]   HISTORY OF PRESENT ILLNESS:  This is a pleasant 57 year old male who is known to Dr. Christella HartiganJacobs for colonoscopy in 2012 that was normal at that time.  He is here today at the request of his PCP, Dr. Drue NovelPaz, for evaluation of dysphagia.  He tells me that for the past 10 years or so he has intermittent episodes where it feels that stuff gets hung up when eating or drinking.  Says that it has been happening more frequently over the past couple of years, but sometimes does not happen for a couple of months at a time.  Other times it happens a couple of times per week.  Admits to occasional reflux.  Used to take pepcid regularly but now really only uses Alka-Seltzer on rare occasion for symptoms.  No weight loss.   Past Medical History:  Diagnosis Date  . Essential hypertension, benign 01/03/2009   Qualifier: Diagnosis of  By: Tawanna Coolerodd MD, Eugenio HoesJeffrey A   . Hyperlipidemia   . Hypertension    Past Surgical History:  Procedure Laterality Date  . NO PAST SURGERIES      reports that he quit smoking about 18 years ago. he has never used smokeless tobacco. He reports that he drinks alcohol. He reports that he does not use drugs. family history includes CAD (age of onset: 3173) in his father; Hyperlipidemia in his other; Hypertension in his other. No Known Allergies    Outpatient Encounter Medications as of 09/04/2017  Medication Sig  . aspirin 81 MG tablet Take 81 mg by mouth daily. Takes 3 tablets daily  . cyclobenzaprine (FLEXERIL) 5 MG tablet One tablet at bedtime (Patient taking differently: Take 5 mg by mouth at bedtime as needed for muscle spasms. )  . famotidine (PEPCID) 10 MG tablet Take 10 mg by mouth 2 (two) times daily as needed for heartburn or indigestion.   . fenofibrate 160 MG tablet Take 1 tablet (160 mg total) by mouth daily.  . fish oil-omega-3 fatty acids 1000 MG capsule Take 2 g by mouth daily.    Marland Kitchen. lisinopril (PRINIVIL,ZESTRIL) 5 MG tablet Take  1 tablet (5 mg total) by mouth daily.  . sildenafil (REVATIO) 20 MG tablet Take 1 tablet (20 mg total) by mouth 3 (three) times daily. (Patient taking differently: Take 20 mg by mouth 3 (three) times daily as needed. )  . simvastatin (ZOCOR) 40 MG tablet Take 1 tablet (40 mg total) by mouth at bedtime.   No facility-administered encounter medications on file as of 09/04/2017.      REVIEW OF SYSTEMS  : All other systems reviewed and negative except where noted in the History of Present Illness.   PHYSICAL EXAM: BP 124/80   Pulse 66   Ht 6' 7.75" (2.026 m)   Wt 216 lb 12.8 oz (98.3 kg)   SpO2 96%   BMI 23.97 kg/m  General: Well developed white male in no acute distress Head: Normocephalic and atraumatic Eyes:  Sclerae anicteric, conjunctiva pink. Ears: Normal auditory acuity Lungs:  Mild end expiratory wheeze noted on the right.. Heart: Regular rate and rhythm; no M/R/G. Abdomen: Soft, non-distended.  BS present.  Non-tender. Musculoskeletal: Symmetrical with no gross deformities  Skin: No lesions on visible extremities Extremities: No edema  Neurological: Alert oriented x 4, grossly non-focal Psychological:  Alert and cooperative. Normal mood and affect  ASSESSMENT AND PLAN: *Dysphagia:  Intermittent for about 10 years but becoming more frequent.  Still not occurring regularly by any means and occurs with both solids and liquids.  ? Esophageal spasm or dysmotility rather than stricture.  Will start with barium esophagram.   CC:  Seth Plump, MD

## 2017-09-04 NOTE — Patient Instructions (Signed)
If you are age 59 or older, your body mass index should be between 23-30. Your Body mass index is 23.97 kg/m. If this is out of the aforementioned range listed, please consider follow up with your Primary Care Provider.  If you are age 59 or younger, your body mass index should be between 19-25. Your Body mass index is 23.97 kg/m. If this is out of the aformentioned range listed, please consider follow up with your Primary Care Provider.   You have been scheduled for a Barium Esophogram at [facility] (1st floor of the hospital) on Tuesday, January 11th at 11:00am. Please arrive 15 minutes prior to your appointment for registration. Make certain not to have anything to eat or drink 3 hours prior to your test. If you need to reschedule for any reason, please contact radiology at [phone] to do so. __________________________________________________________________ A barium swallow is an examination that concentrates on views of the esophagus. This tends to be a double contrast exam (barium and two liquids which, when combined, create a gas to distend the wall of the oesophagus) or single contrast (non-ionic iodine based). The study is usually tailored to your symptoms so a good history is essential. Attention is paid during the study to the form, structure and configuration of the esophagus, looking for functional disorders (such as aspiration, dysphagia, achalasia, motility and reflux) EXAMINATION You may be asked to change into a gown, depending on the type of swallow being performed. A radiologist and radiographer will perform the procedure. The radiologist will advise you of the type of contrast selected for your procedure and direct you during the exam. You will be asked to stand, sit or lie in several different positions and to hold a small amount of fluid in your mouth before being asked to swallow while the imaging is performed .In some instances you may be asked to swallow barium  coated marshmallows to assess the motility of a solid food bolus. The exam can be recorded as a digital or video fluoroscopy procedure. POST PROCEDURE It will take 1-2 days for the barium to pass through your system. To facilitate this, it is important, unless otherwise directed, to increase your fluids for the next 24-48hrs and to resume your normal diet.  This test typically takes about 30 minutes to perform. __________________________________________________________________________________

## 2017-09-07 NOTE — Progress Notes (Signed)
I agree with the above note, plan 

## 2017-09-09 ENCOUNTER — Ambulatory Visit (HOSPITAL_COMMUNITY): Payer: BLUE CROSS/BLUE SHIELD

## 2017-09-17 ENCOUNTER — Ambulatory Visit (HOSPITAL_COMMUNITY)
Admission: RE | Admit: 2017-09-17 | Discharge: 2017-09-17 | Disposition: A | Discharge status: Home / Self Care | Payer: BLUE CROSS/BLUE SHIELD | Source: Ambulatory Visit | Attending: Gastroenterology | Admitting: Gastroenterology

## 2017-09-17 DIAGNOSIS — R1310 Dysphagia, unspecified: Secondary | ICD-10-CM

## 2017-09-17 DIAGNOSIS — K222 Esophageal obstruction: Secondary | ICD-10-CM | POA: Diagnosis not present

## 2017-09-17 DIAGNOSIS — K449 Diaphragmatic hernia without obstruction or gangrene: Secondary | ICD-10-CM | POA: Diagnosis not present

## 2017-09-17 DIAGNOSIS — K224 Dyskinesia of esophagus: Secondary | ICD-10-CM | POA: Insufficient documentation

## 2017-10-13 ENCOUNTER — Other Ambulatory Visit: Payer: Self-pay

## 2017-10-13 ENCOUNTER — Encounter: Payer: Self-pay | Admitting: Gastroenterology

## 2017-10-13 ENCOUNTER — Ambulatory Visit (AMBULATORY_SURGERY_CENTER): Payer: Self-pay | Admitting: *Deleted

## 2017-10-13 VITALS — Ht >= 80 in | Wt 218.8 lb

## 2017-10-13 DIAGNOSIS — R1310 Dysphagia, unspecified: Secondary | ICD-10-CM

## 2017-10-13 NOTE — Progress Notes (Signed)
No egg or soy allergy known to patient  No issues with past sedation with any surgeries  or procedures, no intubation problems  No diet pills per patient No home 02 use per patient  No blood thinners per patient  Pt denies issues with constipation  No A fib or A flutter  EMMI video sent to pt's e mail  Pt. declined 

## 2017-10-17 ENCOUNTER — Ambulatory Visit (AMBULATORY_SURGERY_CENTER): Payer: BLUE CROSS/BLUE SHIELD | Admitting: Gastroenterology

## 2017-10-17 ENCOUNTER — Encounter: Payer: Self-pay | Admitting: Gastroenterology

## 2017-10-17 ENCOUNTER — Other Ambulatory Visit: Payer: Self-pay

## 2017-10-17 VITALS — BP 128/82 | HR 76 | Temp 98.6°F | Resp 13 | Ht >= 80 in | Wt 218.0 lb

## 2017-10-17 DIAGNOSIS — K209 Esophagitis, unspecified without bleeding: Secondary | ICD-10-CM

## 2017-10-17 DIAGNOSIS — K219 Gastro-esophageal reflux disease without esophagitis: Secondary | ICD-10-CM | POA: Diagnosis not present

## 2017-10-17 DIAGNOSIS — K2900 Acute gastritis without bleeding: Secondary | ICD-10-CM

## 2017-10-17 DIAGNOSIS — K210 Gastro-esophageal reflux disease with esophagitis: Secondary | ICD-10-CM | POA: Diagnosis not present

## 2017-10-17 DIAGNOSIS — R1310 Dysphagia, unspecified: Secondary | ICD-10-CM | POA: Diagnosis not present

## 2017-10-17 DIAGNOSIS — K259 Gastric ulcer, unspecified as acute or chronic, without hemorrhage or perforation: Secondary | ICD-10-CM | POA: Diagnosis not present

## 2017-10-17 MED ORDER — SODIUM CHLORIDE 0.9 % IV SOLN: 500.0000 mL | Freq: Once | INTRAVENOUS | Status: DC

## 2017-10-17 MED ORDER — OMEPRAZOLE 40 MG PO CPDR: DELAYED_RELEASE_CAPSULE | ORAL | 11 refills | Status: DC

## 2017-10-17 MED ORDER — OMEPRAZOLE 40 MG PO CPDR: 40.0000 mg | DELAYED_RELEASE_CAPSULE | Freq: Two times a day (BID) | ORAL | 0 refills | Status: DC

## 2017-10-17 MED FILL — OMEPRAZOLE DR 40 MG CAPSULE: 40 | 30 days supply | Qty: 60 | Fill #0 | Status: TO

## 2017-10-17 NOTE — Progress Notes (Signed)
Report to PACU, RN, vss, BBS= Clear.  

## 2017-10-17 NOTE — Progress Notes (Signed)
Pt's states no medical or surgical changes since previsit or office visit. 

## 2017-10-17 NOTE — Op Note (Signed)
[facility] Patient Name: Seth Pineda Procedure Date: 10/17/2017 3:32 PM MRN: [id number] Endoscopist: Rachael Fee , MD Age: 57 Referring MD:  Date of Birth: February 16, 1961 Gender: Male Account #: [id number] Procedure:                Upper GI endoscopy Indications:              Dysphagia Medicines:                Monitored Anesthesia Care Procedure:                Pre-Anesthesia Assessment:                           - Prior to the procedure, a History and Physical                            was performed, and patient medications and                            allergies were reviewed. The patient's tolerance of                            previous anesthesia was also reviewed. The risks                            and benefits of the procedure and the sedation                            options and risks were discussed with the patient.                            All questions were answered, and informed consent                            was obtained. Prior Anticoagulants: The patient has                            taken no previous anticoagulant or antiplatelet                            agents. ASA Grade Assessment: II - A patient with                            mild systemic disease. After reviewing the risks                            and benefits, the patient was deemed in                            satisfactory condition to undergo the procedure.                           After obtaining informed consent, the endoscope was  passed under direct vision. Throughout the                            procedure, the patient's blood pressure, pulse, and                            oxygen saturations were monitored continuously. The                            Model GIF-[id number]) scope was introduced                            through the mouth, and advanced to the second part                            of duodenum. The upper GI endoscopy was                        accomplished without difficulty. The patient                            tolerated the procedure well. Scope In: Scope Out: Findings:                 Ulcerative esophagitis: A Grade C (one or more                            mucosal breaks continuous between tops of 2 or more                            mucosal folds, less than 75% circumference)                            esophagitis was found in the lower third of the                            esophagus.                           Moderate gastritis (linear and somewhat nodular)                            was found in the gastric antrum. Biopsies were                            taken with a cold forceps for histology.                           A small hiatal hernia was present.                           The exam was otherwise without abnormality. Complications:            No immediate complications. Estimated blood loss:  None. Estimated Blood Loss:     Estimated blood loss: none. Impression:               - Severe, ulcerative esophagitis from GERD.                           - Modurate gastritis, biopsied to check for H.                            pylori.                           - Small hiatal hernia.                           - The examination was otherwise normal. Recommendation:           - Patient has a contact number available for                            emergencies. The signs and symptoms of potential                            delayed complications were discussed with the                            patient. Return to normal activities tomorrow.                            Written discharge instructions were provided to the                            patient.                           - Resume previous diet.                           - Continue present medications. New prescription                            today: omeprazole 40mg  pill, take one pill twice                             daily for 2 months then OK to change to once daily                            at that point. (disp 60, 11 refills).                           - Await pathology results.                           - Avoid excessive NSAID type OTC pain meds. Rachael Feeaniel P Karo Rog, MD 10/17/2017 3:48:21 PM This report has been signed electronically.

## 2017-10-17 NOTE — Patient Instructions (Signed)
YOU HAD AN ENDOSCOPIC PROCEDURE TODAY AT THE  ENDOSCOPY CENTER:   Refer to the procedure report that was given to you for any specific questions about what was found during the examination.  If the procedure report does not answer your questions, please call your gastroenterologist to clarify.  If you requested that your care partner not be given the details of your procedure findings, then the procedure report has been included in a sealed envelope for you to review at your convenience later.  YOU SHOULD EXPECT: Some feelings of bloating in the abdomen. Passage of more gas than usual.  Walking can help get rid of the air that was put into your GI tract during the procedure and reduce the bloating.   Please Note:  You might notice some irritation and congestion in your nose or some drainage.  This is from the oxygen used during your procedure.  There is no need for concern and it should clear up in a day or so.  SYMPTOMS TO REPORT IMMEDIATELY:    Following upper endoscopy (EGD)  Vomiting of blood or coffee ground material  New chest pain or pain under the shoulder blades  Painful or persistently difficult swallowing  New shortness of breath  Fever of 100F or higher  Black, tarry-looking stools  For urgent or emergent issues, a gastroenterologist can be reached at any hour by calling [phone].   DIET:  We do recommend a small meal at first, but then you may proceed to your regular diet.  Drink plenty of fluids but you should avoid alcoholic beverages for 24 hours.  ACTIVITY:  You should plan to take it easy for the rest of today and you should NOT DRIVE or use heavy machinery until tomorrow (because of the sedation medicines used during the test).    FOLLOW UP: Our staff will call the number listed on your records the next business day following your procedure to check on you and address any questions or concerns that you may have regarding the information given to you  following your procedure. If we do not reach you, we will leave a message.  However, if you are feeling well and you are not experiencing any problems, there is no need to return our call.  We will assume that you have returned to your regular daily activities without incident.  If any biopsies were taken you will be contacted by phone or by letter within the next 1-3 weeks.  Please call us at [phone] if you have not heard about the biopsies in 3 weeks.    SIGNATURES/CONFIDENTIALITY: You and/or your care partner have signed paperwork which will be entered into your electronic medical record.  These signatures attest to the fact that that the information above on your After Visit Summary has been reviewed and is understood.  Full responsibility of the confidentiality of this discharge information lies with you and/or your care-partner.  Be sure to take your medications as ordered.

## 2017-10-17 NOTE — Progress Notes (Signed)
Called to room to assist during endoscopic procedure.  Patient ID and intended procedure confirmed with present staff. Received instructions for my participation in the procedure from the performing physician.  

## 2017-10-20 ENCOUNTER — Telehealth: Payer: Self-pay

## 2017-10-20 ENCOUNTER — Telehealth: Payer: Self-pay | Admitting: *Deleted

## 2017-10-20 NOTE — Telephone Encounter (Signed)
  Follow up Call-  Call back number 10/17/2017  Post procedure Call Back phone  # [phone]  Permission to leave phone message Yes  Some recent data might be hidden     Patient questions:  Do you have a fever, pain , or abdominal swelling? No. Pain Score  0 *  Have you tolerated food without any problems? Yes.    Have you been able to return to your normal activities? Yes.    Do you have any questions about your discharge instructions: Diet   No. Medications  No. Follow up visit  No.  Do you have questions or concerns about your Care? No.  Actions: * If pain score is 4 or above: No action needed, pain <4.

## 2017-10-20 NOTE — Telephone Encounter (Signed)
Called [phone] and left a messaged we tried to reach pt for a follow up call. maw

## 2017-10-24 ENCOUNTER — Encounter: Payer: Self-pay | Admitting: Gastroenterology

## 2017-11-11 MED FILL — LISINOPRIL 5 MG TAB: 5 | 90 days supply | Qty: 90 | Fill #0

## 2017-11-11 MED FILL — SIMVASTATIN 40 MG TABLET: 40 | 90 days supply | Qty: 90 | Fill #0

## 2017-11-18 ENCOUNTER — Telehealth: Payer: Self-pay | Admitting: Internal Medicine

## 2017-11-18 NOTE — Telephone Encounter (Signed)
Copied from CRM [phone]. Topic: Quick Communication - See Telephone Encounter >> Nov 18, 2017  4:07 PM Ninfa MeekerPoole, Bridgett H wrote: CRM for notification. See Telephone encounter for:   11/18/17.  Left vmail to reschedule appt per pcp on 12/08/17.

## 2017-12-08 ENCOUNTER — Encounter: Payer: BLUE CROSS/BLUE SHIELD | Admitting: Internal Medicine

## 2017-12-24 ENCOUNTER — Encounter: Payer: Self-pay | Admitting: Internal Medicine

## 2017-12-24 ENCOUNTER — Ambulatory Visit (INDEPENDENT_AMBULATORY_CARE_PROVIDER_SITE_OTHER): Payer: BLUE CROSS/BLUE SHIELD | Admitting: Internal Medicine

## 2017-12-24 VITALS — BP 128/80 | HR 82 | Temp 98.1°F | Resp 14 | Ht >= 80 in | Wt 226.2 lb

## 2017-12-24 DIAGNOSIS — E781 Pure hyperglyceridemia: Secondary | ICD-10-CM

## 2017-12-24 DIAGNOSIS — Z0000 Encounter for general adult medical examination without abnormal findings: Secondary | ICD-10-CM | POA: Diagnosis not present

## 2017-12-24 DIAGNOSIS — R7989 Other specified abnormal findings of blood chemistry: Secondary | ICD-10-CM

## 2017-12-24 NOTE — Patient Instructions (Addendum)
  GO TO THE FRONT DESK  Schedule labs to be done this or next week.  Fasting, early in the morning.  Schedule your next appointment for a physical exam in 1 year    Visit the [facility] website for good information about the stretching (videos).

## 2017-12-24 NOTE — Assessment & Plan Note (Signed)
-   Td 2010 -- CCS: cscope 2012, 10 years -- prostate ca screen: DRE normal today, check a PSA --Diet and exercise discussed --Labs: FLP, PSA, CMP, testosterone

## 2017-12-24 NOTE — Progress Notes (Signed)
Subjective:    Patient ID: Seth Pineda, male    DOB: [birth year], 59 y.o.   MRN: [id number]  DOS:  12/24/2017 Type of visit - description : cpx Interval history: In general feeling well   Review of Systems Has chronic, on and off back pain for years.  Mild, taking Flexeril and Ultram very rarely.  No radiation to the lower extremities.  Other than above, a 14 point review of systems is negative      Past Medical History:  Diagnosis Date  . Essential hypertension, benign 01/03/2009   Qualifier: Diagnosis of  By: Tawanna Coolerodd MD, Eugenio HoesJeffrey A   . GERD (gastroesophageal reflux disease)    no longer on Pepcid    . Hyperlipidemia   . Hypertension     Past Surgical History:  Procedure Laterality Date  . COLONOSCOPY    . NO PAST SURGERIES      Social History   Socioeconomic History  . Marital status: Married    Spouse name: Not on file  . Number of children: 1  . Years of education: Not on file  . Highest education level: Not on file  Occupational History  . 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 Financial resource strain: Not on file  . Food insecurity:    Worry: Not on file    Inability: Not on file  . Transportation needs:    Medical: Not on file    Non-medical: Not on file  Tobacco Use  . Smoking status: Former Smoker    Last attempt to quit: 05/31/1999    Years since quitting: 18.5  . Smokeless tobacco: Never Used  Substance and Sexual Activity  . Alcohol use: Yes    Comment: occasional beer  . Drug use: No  . Sexual activity: Not on file  Lifestyle  . Physical activity:    Days per week: Not on file    Minutes per session: Not on file  . Stress: Not on file  Relationships  . Social connections:    Talks on phone: Not on file    Gets together: Not on file    Attends religious service: Not on file    Active member of club or organization: Not on file    Attends meetings of clubs or organizations: Not on file    Relationship status: Not on file   . Intimate partner violence:    Fear of current or ex partner: Not on file    Emotionally abused: Not on file    Physically abused: Not on file    Forced sexual activity: Not on file  Other Topics Concern  . Not on file  Social History Narrative   Wife is a Charity fundraiserN   Child in college     Family History  Problem Relation Age of Onset  . Hyperlipidemia Other   . Hypertension Other   . CAD Father [id number]  . Dementia Mother   . Colon cancer Neg Hx   . Rectal cancer Neg Hx   . Stomach cancer Neg Hx   . Colon polyps Neg Hx   . Esophageal cancer Neg Hx   . Diabetes Neg Hx      Allergies as of 12/24/2017   No Known Allergies     Medication List        Accurate as of 12/24/17  4:46 PM. Always use your most recent med list.          cyclobenzaprine 5 MG tablet  Commonly known as:  FLEXERIL One tablet at bedtime   fenofibrate 160 MG tablet Take 1 tablet (160 mg total) by mouth daily.   fish oil-omega-3 fatty acids 1000 MG capsule Take 2 g by mouth daily.   lisinopril 5 MG tablet Commonly known as:  PRINIVIL,ZESTRIL Take 1 tablet (5 mg total) by mouth daily.   omeprazole 40 MG capsule Commonly known as:  PRILOSEC Omeprazole 40mg  twice daily for 2 months, then once per day 1/2 hour before breakfast there after.   sildenafil 20 MG tablet Commonly known as:  REVATIO Take 1 tablet (20 mg total) by mouth 3 (three) times daily.   simvastatin 40 MG tablet Commonly known as:  ZOCOR Take 1 tablet (40 mg total) by mouth at bedtime.          Objective:   Physical Exam BP 128/80 (BP Location: Left Arm, Patient Position: Sitting, Cuff Size: Normal)   Pulse 82   Temp 98.1 F (36.7 C) (Oral)   Resp 14   Ht 6\' 8"  (2.032 m)   Wt 226 lb 4 oz (102.6 kg)   SpO2 98%   BMI 24.85 kg/m  General:   Well developed, well nourished . NAD.  Neck: No  thyromegaly  HEENT:  Normocephalic . Face symmetric, atraumatic Lungs:  CTA B Normal respiratory effort, no intercostal retractions,  no accessory muscle use. Heart: RRR,  no murmur.  No pretibial edema bilaterally  Abdomen:  Not distended, soft, non-tender. No rebound or rigidity.   Skin: Exposed areas without rash. Not pale. Not jaundice Rectal: External abnormalities: none. Normal sphincter tone. No rectal masses or tenderness.  No stools Prostate: Prostate gland firm and smooth, no enlargement, nodularity, tenderness, mass, asymmetry or induration Neurologic:  alert & oriented X3.  Speech normal, gait appropriate for age and unassisted Strength symmetric and appropriate for age.  Psych: Cognition and judgment appear intact.  Cooperative with normal attention span and concentration.  Behavior appropriate. No anxious or depressed appearing.     Assessment & Plan:    Assessment Hyperglycemia.  A1c 5.6 (06/2017 ) HTN dx in his 38 Dyslipidemia Back pain  E.D. h/o low testosterone 2012  Plan: Hyperglycemia: Diet and exercise discussed HTN: Continue lisinopril. Dyslipidemia: Last triglycerides elevated, he restarted fenofibrate, also on simvastatin.  Checking labs Back pain: Episodic problem, chronic.  Not severe.  Recommend stretching.  Takes Flexeril and tramadol rarely. Low testosterone: Rechecking labs.  Denies decreased libido, no lack of energy. RTC 1 year

## 2017-12-24 NOTE — Progress Notes (Signed)
Pre visit review using our clinic review tool, if applicable. No additional management support is needed unless otherwise documented below in the visit note. 

## 2017-12-24 NOTE — Assessment & Plan Note (Signed)
Hyperglycemia: Diet and exercise discussed HTN: Continue lisinopril. Dyslipidemia: Last triglycerides elevated, he restarted fenofibrate, also on simvastatin.  Checking labs Back pain: Episodic problem, chronic.  Not severe.  Recommend stretching.  Takes Flexeril and tramadol rarely. Low testosterone: Rechecking labs.  Denies decreased libido, no lack of energy. RTC 1 year

## 2017-12-25 MED FILL — FENOFIBRATE 160 MG TABLET: 160 | 90 days supply | Qty: 90 | Fill #1

## 2017-12-26 ENCOUNTER — Other Ambulatory Visit (INDEPENDENT_AMBULATORY_CARE_PROVIDER_SITE_OTHER): Payer: BLUE CROSS/BLUE SHIELD

## 2017-12-26 DIAGNOSIS — E781 Pure hyperglyceridemia: Secondary | ICD-10-CM

## 2017-12-26 DIAGNOSIS — R7989 Other specified abnormal findings of blood chemistry: Secondary | ICD-10-CM | POA: Diagnosis not present

## 2017-12-26 DIAGNOSIS — Z0000 Encounter for general adult medical examination without abnormal findings: Secondary | ICD-10-CM

## 2017-12-26 LAB — LIPID PANEL
Cholesterol: 199 mg/dL (ref 0–200)
HDL: 36.2 mg/dL — ABNORMAL LOW (ref >39.00)
NONHDL: 162.83
Total CHOL/HDL Ratio: 5
Triglycerides: 253 mg/dL — ABNORMAL HIGH (ref 0.0–149.0)
VLDL: 50.6 mg/dL — ABNORMAL HIGH (ref 0.0–40.0)

## 2017-12-26 LAB — COMPREHENSIVE METABOLIC PANEL
ALBUMIN: 4.3 g/dL (ref 3.5–5.2)
ALK PHOS: 40 U/L (ref 39–117)
ALT: 23 U/L (ref 0–53)
AST: 20 U/L (ref 0–37)
BUN: 14 mg/dL (ref 6–23)
CO2: 27 mEq/L (ref 19–32)
Calcium: 9.3 mg/dL (ref 8.4–10.5)
Chloride: 106 mEq/L (ref 96–112)
Creatinine, Ser: 0.95 mg/dL (ref 0.40–1.50)
GFR: 86.26 mL/min (ref >60.00)
GLUCOSE: 116 mg/dL — AB (ref 70–99)
POTASSIUM: 4.3 meq/L (ref 3.5–5.1)
Sodium: 142 mEq/L (ref 135–145)
TOTAL PROTEIN: 6.9 g/dL (ref 6.0–8.3)
Total Bilirubin: 0.5 mg/dL (ref 0.2–1.2)

## 2017-12-26 LAB — PSA: PSA: 0.27 ng/mL (ref 0.10–4.00)

## 2017-12-26 LAB — LDL CHOLESTEROL, DIRECT: LDL DIRECT: 131 mg/dL

## 2017-12-29 LAB — TESTOSTERONE TOTAL,FREE,BIO, MALES
Albumin: 4.5 g/dL (ref 3.6–5.1)
SEX HORMONE BINDING: 29 nmol/L (ref 22–77)
TESTOSTERONE FREE: 57.9 pg/mL (ref 46.0–224.0)
TESTOSTERONE: 399 ng/dL (ref 250–827)
Testosterone, Bioavailable: 119.2 ng/dL (ref >=110.0)

## 2018-01-05 MED FILL — OMEPRAZOLE 40 MG CPDR: 40 | 30 days supply | Qty: 60 | Fill #0

## 2018-03-12 MED FILL — SIMVASTATIN 40 MG TABLET: 40 | 90 days supply | Qty: 90 | Fill #1

## 2018-03-12 MED FILL — OMEPRAZOLE 40 MG CPDR: 40 | 30 days supply | Qty: 60 | Fill #1

## 2018-03-12 MED FILL — LISINOPRIL 5 MG TABLET: 5 | 90 days supply | Qty: 90 | Fill #1

## 2018-04-08 ENCOUNTER — Other Ambulatory Visit: Payer: Self-pay | Admitting: Internal Medicine

## 2018-04-09 MED FILL — FENOFIBRATE 160 MG TABS: 160 | 90 days supply | Qty: 90 | Fill #0

## 2018-05-20 MED FILL — OMEPRAZOLE 40 MG CPDR: 40 | 60 days supply | Qty: 60 | Fill #2

## 2018-08-04 MED FILL — OMEPRAZOLE 40 MG CPDR: 40 | 60 days supply | Qty: 60 | Fill #3

## 2019-01-01 ENCOUNTER — Telehealth: Payer: Self-pay

## 2019-01-01 NOTE — Telephone Encounter (Signed)
Tried calling Pt to inform that he is due for cpe- bcbs allowing virtual cpe's. No answer, unable to leave message.

## 2019-03-29 MED FILL — FENOFIBRATE 160 MG TABLET: 160 | 90 days supply | Qty: 90 | Fill #1

## 2019-03-30 ENCOUNTER — Other Ambulatory Visit: Payer: Self-pay

## 2019-03-30 MED ORDER — FENOFIBRATE 160 MG PO TABS: 160.0000 mg | ORAL_TABLET | Freq: Every day | ORAL | 0 refills | Status: DC

## 2019-03-30 MED ORDER — SIMVASTATIN 40 MG PO TABS: 40.0000 mg | ORAL_TABLET | Freq: Every day | ORAL | 0 refills | Status: DC

## 2019-03-30 MED ORDER — LISINOPRIL 5 MG PO TABS: 5.0000 mg | ORAL_TABLET | Freq: Every day | ORAL | 0 refills | Status: DC

## 2019-03-30 MED FILL — SIMVASTATIN 40 MG TABLET: 40 | 30 days supply | Qty: 30 | Fill #0

## 2019-03-30 MED FILL — LISINOPRIL 5 MG TABLET: 5 | 30 days supply | Qty: 30 | Fill #0

## 2019-03-30 NOTE — Addendum Note (Signed)
Addended byDamita Dunnings D on: 03/30/2019 08:43 AM   Modules accepted: Orders

## 2019-03-31 ENCOUNTER — Other Ambulatory Visit: Payer: Self-pay

## 2019-03-31 MED ORDER — OMEPRAZOLE 40 MG PO CPDR: 40.0000 mg | DELAYED_RELEASE_CAPSULE | Freq: Every day | ORAL | 3 refills | Status: AC

## 2019-03-31 MED FILL — OMEPRAZOLE 40 MG CPDR: 40 | 90 days supply | Qty: 90 | Fill #0

## 2019-04-05 ENCOUNTER — Encounter: Payer: Self-pay | Admitting: Internal Medicine

## 2019-05-05 ENCOUNTER — Other Ambulatory Visit: Payer: Self-pay | Admitting: Internal Medicine

## 2019-05-05 MED ORDER — LISINOPRIL 5 MG PO TABS: 5.0000 mg | ORAL_TABLET | Freq: Every day | ORAL | 0 refills | Status: DC

## 2019-05-05 MED ORDER — SIMVASTATIN 40 MG PO TABS: 40.0000 mg | ORAL_TABLET | Freq: Every day | ORAL | 0 refills | Status: DC

## 2019-05-05 MED ORDER — FENOFIBRATE 160 MG PO TABS: 160.0000 mg | ORAL_TABLET | Freq: Every day | ORAL | 0 refills | Status: DC

## 2019-05-05 MED FILL — SIMVASTATIN 40 MG TABLET: 40 | 30 days supply | Qty: 30 | Fill #0

## 2019-05-05 MED FILL — LISINOPRIL 5 MG TABLET: 5 | 30 days supply | Qty: 30 | Fill #0

## 2019-05-05 NOTE — Telephone Encounter (Signed)
Pt's meds was denied because he need an appt and he has an appt 9.29.20. pt want to know if he can still get them. Please call to advise.

## 2019-05-05 NOTE — Addendum Note (Signed)
Addended by: Damita Dunnings D on: 05/05/2019 11:43 AM   Modules accepted: Orders

## 2019-05-05 NOTE — Telephone Encounter (Signed)
30 day supply sent- no further refills w/o appt.

## 2019-06-01 ENCOUNTER — Other Ambulatory Visit: Payer: Self-pay

## 2019-06-01 ENCOUNTER — Telehealth: Payer: Self-pay

## 2019-06-01 ENCOUNTER — Ambulatory Visit (INDEPENDENT_AMBULATORY_CARE_PROVIDER_SITE_OTHER): Payer: BLUE CROSS/BLUE SHIELD | Admitting: Internal Medicine

## 2019-06-01 ENCOUNTER — Encounter: Payer: Self-pay | Admitting: Internal Medicine

## 2019-06-01 VITALS — BP 121/81 | HR 91 | Temp 96.5°F | Resp 16 | Ht >= 80 in | Wt 235.5 lb

## 2019-06-01 DIAGNOSIS — N529 Male erectile dysfunction, unspecified: Secondary | ICD-10-CM | POA: Insufficient documentation

## 2019-06-01 DIAGNOSIS — E119 Type 2 diabetes mellitus without complications: Secondary | ICD-10-CM

## 2019-06-01 DIAGNOSIS — E781 Pure hyperglyceridemia: Secondary | ICD-10-CM

## 2019-06-01 DIAGNOSIS — Z23 Encounter for immunization: Secondary | ICD-10-CM | POA: Diagnosis not present

## 2019-06-01 DIAGNOSIS — Z0000 Encounter for general adult medical examination without abnormal findings: Secondary | ICD-10-CM | POA: Diagnosis not present

## 2019-06-01 DIAGNOSIS — M5441 Lumbago with sciatica, right side: Secondary | ICD-10-CM

## 2019-06-01 LAB — CBC WITH DIFFERENTIAL/PLATELET
Basophils Absolute: 0.1 10*3/uL (ref 0.0–0.1)
Basophils Relative: 0.6 % (ref 0.0–3.0)
Eosinophils Absolute: 0.2 10*3/uL (ref 0.0–0.7)
Eosinophils Relative: 2.4 % (ref 0.0–5.0)
HCT: 48.2 % (ref 39.0–52.0)
Hemoglobin: 16 g/dL (ref 13.0–17.0)
Lymphocytes Relative: 30.8 % (ref 12.0–46.0)
Lymphs Abs: 3.1 10*3/uL (ref 0.7–4.0)
MCHC: 33.3 g/dL (ref 30.0–36.0)
MCV: 92.6 fl (ref 78.0–100.0)
Monocytes Absolute: 0.8 10*3/uL (ref 0.1–1.0)
Monocytes Relative: 7.7 % (ref 3.0–12.0)
Neutro Abs: 5.9 10*3/uL (ref 1.4–7.7)
Neutrophils Relative %: 58.5 % (ref 43.0–77.0)
Platelets: 256 10*3/uL (ref 150.0–400.0)
RBC: 5.21 Mil/uL (ref 4.22–5.81)
RDW: 14 % (ref 11.5–15.5)
WBC: 10 10*3/uL (ref 4.0–10.5)

## 2019-06-01 LAB — COMPREHENSIVE METABOLIC PANEL
ALT: 25 U/L (ref 0–53)
AST: 17 U/L (ref 0–37)
Albumin: 4.6 g/dL (ref 3.5–5.2)
Alkaline Phosphatase: 42 U/L (ref 39–117)
BUN: 19 mg/dL (ref 6–23)
CO2: 25 mEq/L (ref 19–32)
Calcium: 10 mg/dL (ref 8.4–10.5)
Chloride: 104 mEq/L (ref 96–112)
Creatinine, Ser: 0.85 mg/dL (ref 0.40–1.50)
GFR: 91.83 mL/min (ref >60.00)
Glucose, Bld: 139 mg/dL — ABNORMAL HIGH (ref 70–99)
Potassium: 4.2 mEq/L (ref 3.5–5.1)
Sodium: 138 mEq/L (ref 135–145)
Total Bilirubin: 0.6 mg/dL (ref 0.2–1.2)
Total Protein: 7.2 g/dL (ref 6.0–8.3)

## 2019-06-01 LAB — LIPID PANEL
Cholesterol: 200 mg/dL (ref 0–200)
HDL: 34.6 mg/dL — ABNORMAL LOW (ref >39.00)
NonHDL: 165.78
Total CHOL/HDL Ratio: 6
Triglycerides: 372 mg/dL — ABNORMAL HIGH (ref 0.0–149.0)
VLDL: 74.4 mg/dL — ABNORMAL HIGH (ref 0.0–40.0)

## 2019-06-01 LAB — LDL CHOLESTEROL, DIRECT: Direct LDL: 119 mg/dL

## 2019-06-01 LAB — HEMOGLOBIN A1C: Hgb A1c MFr Bld: 6.5 % (ref 4.6–6.5)

## 2019-06-01 LAB — TSH: TSH: 2.14 u[IU]/mL (ref 0.35–4.50)

## 2019-06-01 MED ORDER — SILDENAFIL CITRATE 20 MG PO TABS: ORAL_TABLET | ORAL | 1 refills | Status: DC

## 2019-06-01 NOTE — Assessment & Plan Note (Signed)
-   Td 2020 - shingrex d/w pt, will wait -Had a flu shot - CCS: cscope 2012, 10 years - prostate ca screen: DRE -PSA wnl 2019 - Diet and exercise discussed - Labs: CMP, FLP, CBC, A1c, TSH

## 2019-06-01 NOTE — Assessment & Plan Note (Signed)
Hyperglycemia: Check A1c, lifestyle discussed HTN: On lisinopril, checking labs Dyslipidemia: On fenofibrate and simvastatin.  Checking labs ED: On sildenafil as needed History of low testosterone: Last testosterone normal. GERD: Under excellent control with omeprazole.  RTC 1 year

## 2019-06-01 NOTE — Telephone Encounter (Signed)
PA initiated via Covermymeds; KEY: [place]. Awaiting determination.

## 2019-06-01 NOTE — Progress Notes (Signed)
Subjective:    Patient ID: Seth Pineda, male    DOB: 1960/09/11, 58 y.o.   MRN: [id number]  DOS:  06/01/2019 Type of visit - description: CPX No major concerns GI symptoms under excellent control with omeprazole.  Review of Systems  A 14 point review of systems is negative    Past Medical History:  Diagnosis Date  . Essential hypertension, benign 01/03/2009   Qualifier: Diagnosis of  By: Tawanna Cooler MD, Eugenio Hoes   . GERD (gastroesophageal reflux disease)    no longer on Pepcid    . Hyperlipidemia   . Hypertension     Past Surgical History:  Procedure Laterality Date  . COLONOSCOPY    . NO PAST SURGERIES      Social History   Socioeconomic History  . Marital status: Married    Spouse name: Not on file  . Number of children: 1  . Years of education: Not on file  . Highest education level: Not on file  Occupational History  . 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 Financial resource strain: Not on file  . Food insecurity    Worry: Not on file    Inability: Not on file  . Transportation needs    Medical: Not on file    Non-medical: Not on file  Tobacco Use  . Smoking status: Former Smoker    Quit date: 05/31/1999    Years since quitting: 20.0  . Smokeless tobacco: Never Used  Substance and Sexual Activity  . Alcohol use: Yes    Comment: occasional beer  . Drug use: No  . Sexual activity: Not on file  Lifestyle  . Physical activity    Days per week: Not on file    Minutes per session: Not on file  . Stress: Not on file  Relationships  . Social Musician on phone: Not on file    Gets together: Not on file    Attends religious service: Not on file    Active member of club or organization: Not on file    Attends meetings of clubs or organizations: Not on file    Relationship status: Not on file  . Intimate partner violence    Fear of current or ex partner: Not on file    Emotionally abused: Not on file    Physically abused: Not  on file    Forced sexual activity: Not on file  Other Topics Concern  . Not on file  Social History Narrative   Wife is a Charity fundraiser   Child in college     Family History  Problem Relation Age of Onset  . Hyperlipidemia Other   . Hypertension Other   . CAD Father 40  . Dementia Mother   . Colon cancer Neg Hx   . Rectal cancer Neg Hx   . Stomach cancer Neg Hx   . Colon polyps Neg Hx   . Esophageal cancer Neg Hx   . Diabetes Neg Hx   . Prostate cancer Neg Hx      Allergies as of 06/01/2019   No Known Allergies     Medication List       Accurate as of June 01, 2019  1:27 PM. If you have any questions, ask your nurse or doctor.        cyclobenzaprine 5 MG tablet Commonly known as: FLEXERIL One tablet at bedtime   fenofibrate 160 MG tablet Take 1 tablet (160 mg total)  by mouth daily.   fish oil-omega-3 fatty acids 1000 MG capsule Take 2 g by mouth daily.   lisinopril 5 MG tablet Commonly known as: ZESTRIL Take 1 tablet (5 mg total) by mouth daily.   omeprazole 40 MG capsule Commonly known as: PRILOSEC Take 1 capsule (40 mg total) by mouth daily before breakfast.   sildenafil 20 MG tablet Commonly known as: Revatio TAKE 3-4 TABLETS BY MOUTH DAILY AS NEEDED What changed:   how much to take  how to take this  when to take this  additional instructions Changed by: Kathlene November, MD   simvastatin 40 MG tablet Commonly known as: ZOCOR Take 1 tablet (40 mg total) by mouth at bedtime.           Objective:   Physical Exam BP 121/81 (BP Location: Left Arm, Patient Position: Sitting, Cuff Size: Normal)   Pulse 91   Temp (!) 96.5 F (35.8 C) (Temporal)   Resp 16   Ht 6\' 8"  (2.032 m)   Wt 235 lb 8 oz (106.8 kg)   SpO2 100%   BMI 25.87 kg/m  General: Well developed, NAD, BMI noted Neck: No  thyromegaly  HEENT:  Normocephalic . Face symmetric, atraumatic Lungs:  CTA B Normal respiratory effort, no intercostal retractions, no accessory muscle use.  Heart: RRR,  no murmur.  No pretibial edema bilaterally  Abdomen:  Not distended, soft, non-tender. No rebound or rigidity.   Skin: Exposed areas without rash. Not pale. Not jaundice Neurologic:  alert & oriented X3.  Speech normal, gait appropriate for age and unassisted Strength symmetric and appropriate for age.  Psych: Cognition and judgment appear intact.  Cooperative with normal attention span and concentration.  Behavior appropriate. No anxious or depressed appearing.     Assessment    ASSESSMENT Hyperglycemia.  A1c 5.6 (06/2017 ) HTN dx in his 38 Dyslipidemia Back pain  GERD: EGD 10-2017, severe ulcerative esophagitis.  BX negative for H. pylori or dysplasia. E.D. h/o low testosterone   PLAN Hyperglycemia: Check A1c, lifestyle discussed HTN: On lisinopril, checking labs Dyslipidemia: On fenofibrate and simvastatin.  Checking labs ED: On sildenafil as needed History of low testosterone: Last testosterone normal. GERD: Under excellent control with omeprazole.  RTC 1 year

## 2019-06-01 NOTE — Patient Instructions (Signed)
GO TO THE LAB : Get the blood work     GO TO THE FRONT DESK Schedule your next appointment   for physical exam in 1 year    Check the  blood pressure monthly BP GOAL is between 110/65 and  135/85. If it is consistently higher or lower, let me know    

## 2019-06-01 NOTE — Progress Notes (Signed)
Pre visit review using our clinic review tool, if applicable. No additional management support is needed unless otherwise documented below in the visit note. 

## 2019-06-02 NOTE — Telephone Encounter (Signed)
Coverage for sildenafil only for those w/ pulmonary arterial hypertension.

## 2019-06-02 NOTE — Telephone Encounter (Signed)
PA denied. Awaiting further information.  ?

## 2019-06-04 DIAGNOSIS — E119 Type 2 diabetes mellitus without complications: Secondary | ICD-10-CM | POA: Insufficient documentation

## 2019-06-04 MED ORDER — ATORVASTATIN CALCIUM 40 MG PO TABS: 40.0000 mg | ORAL_TABLET | Freq: Every day | ORAL | 1 refills | Status: DC

## 2019-06-04 MED ORDER — FENOFIBRATE 160 MG PO TABS: 160.0000 mg | ORAL_TABLET | Freq: Every day | ORAL | 1 refills | Status: DC

## 2019-06-04 MED ORDER — LISINOPRIL 5 MG PO TABS: 5.0000 mg | ORAL_TABLET | Freq: Every day | ORAL | 1 refills | Status: DC

## 2019-06-04 MED ORDER — CYCLOBENZAPRINE HCL 5 MG PO TABS: 5.0000 mg | ORAL_TABLET | Freq: Every evening | ORAL | 1 refills | Status: DC | PRN

## 2019-06-04 MED FILL — CYCLOBENZAPRINE HCL 5 MG TA: 5 | 90 days supply | Qty: 90 | Fill #0

## 2019-06-04 MED FILL — ATORVASTATIN 40 MG TABLET: 40 | 90 days supply | Qty: 90 | Fill #0

## 2019-06-04 MED FILL — LISINOPRIL 5 MG TABLET: 5 | 90 days supply | Qty: 90 | Fill #0

## 2019-06-04 MED FILL — SILDENAFIL CITRATE 20 MG TA: 20 | 30 days supply | Qty: 30 | Fill #0

## 2019-06-04 NOTE — Addendum Note (Signed)
Addended byDamita Dunnings D on: 06/04/2019 09:13 AM   Modules accepted: Orders

## 2019-08-07 IMAGING — RF DG ESOPHAGUS
9 of 13 series · 12 of 24 positions shown · non-contrast
Comparison: None.

CLINICAL DATA: 56-year-old male with history of esophageal spasm
once or twice a month. Dysphagia with foods and not liquids.
Intermittent heartburn.

EXAM:
ESOPHOGRAM / BARIUM SWALLOW / BARIUM TABLET STUDY
TECHNIQUE: Combined double contrast and single contrast examination performed
using effervescent crystals, thick barium liquid, and thin barium
liquid. The patient was observed with fluoroscopy swallowing a 13 mm
barium sulphate tablet.
FLUOROSCOPY TIME:  Fluoroscopy Time:  3.4 minutes
Radiation Exposure Index (if provided by the fluoroscopic device):
17.7 mGy

[Series 2: fluoro_barium 2fps_bw · 0.17mm/px · 1 of 1 slices shown (1 of 2)]
[im 1/1]
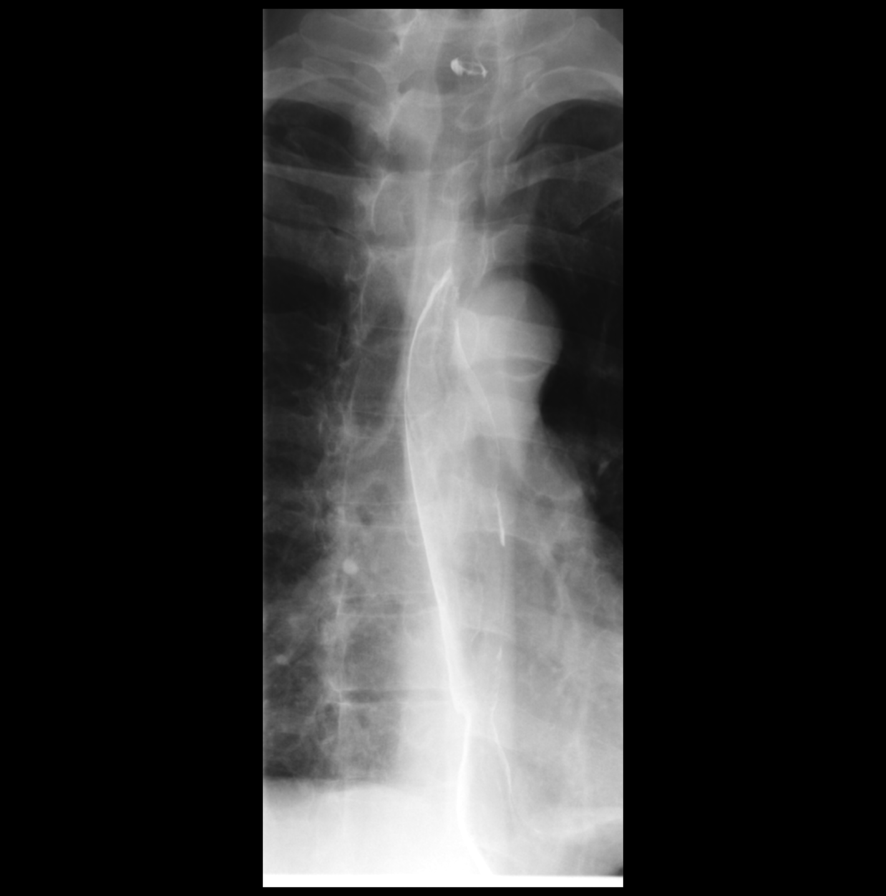

[Series 5: cp_standard · 0.53mm/px · 2 of 46 frames shown (1 of 7)]
[frame 7/46]
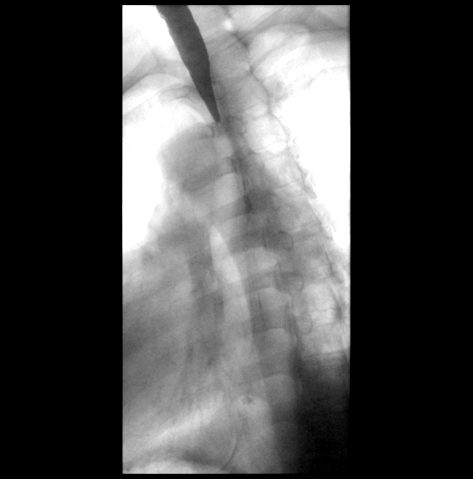
[frame 24/46]
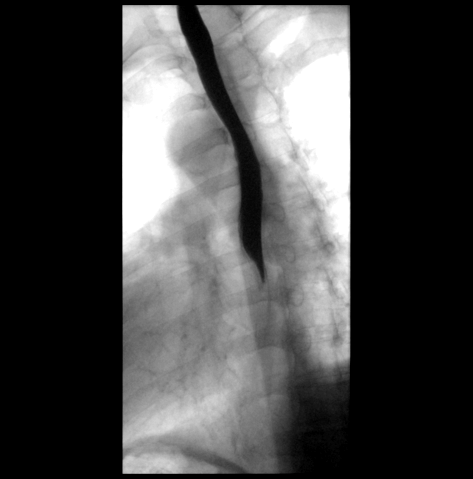

[Series 6: cp_standard · 0.53mm/px · 2 of 30 frames shown (2 of 7)]
[frame 16/30]
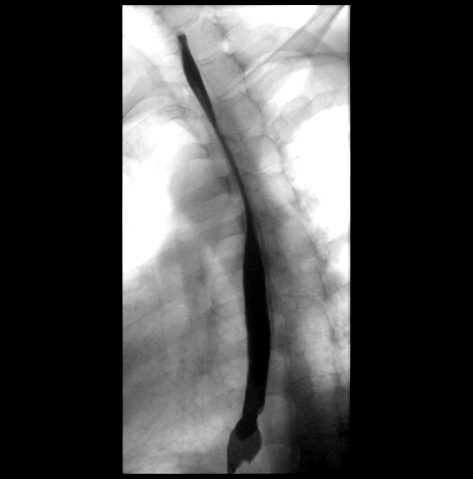
[frame 30/30]
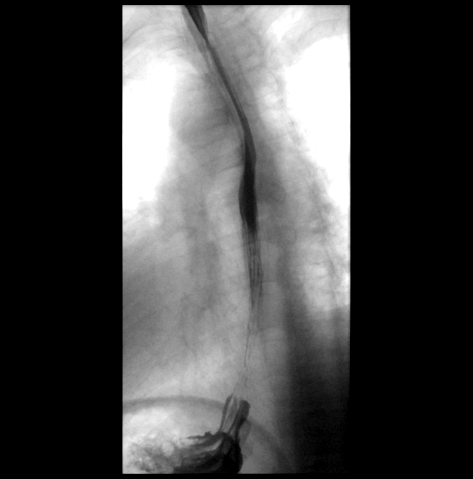

[Series 7: cp_standard · 0.53mm/px · 1 of 38 frames shown (3 of 7)]
[frame 20/38]
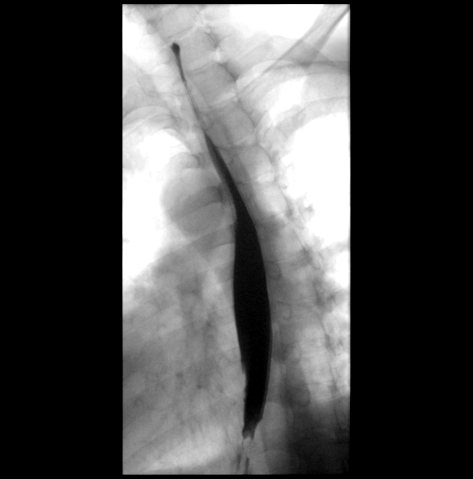

[Series 8: cp_standard · 0.53mm/px · 2 of 39 frames shown (4 of 7)]
[frame 6/39]
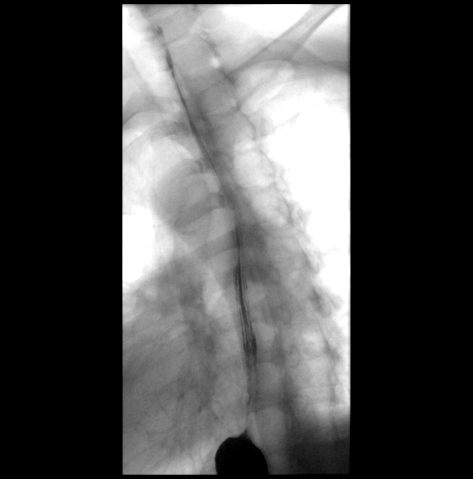
[frame 34/39]
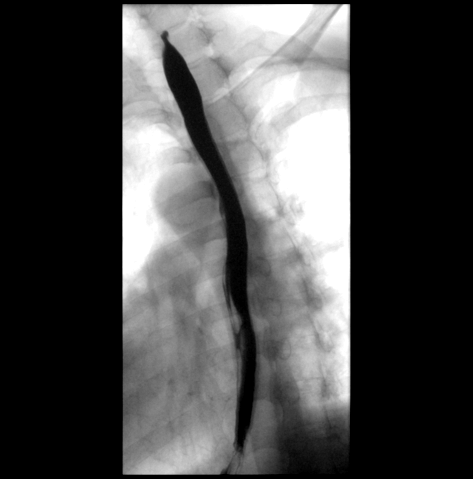

[Series 10: fluoro_barium 2fps_bw · 0.18mm/px · 1 of 1 slices shown (2 of 2)]
[im 1/1]
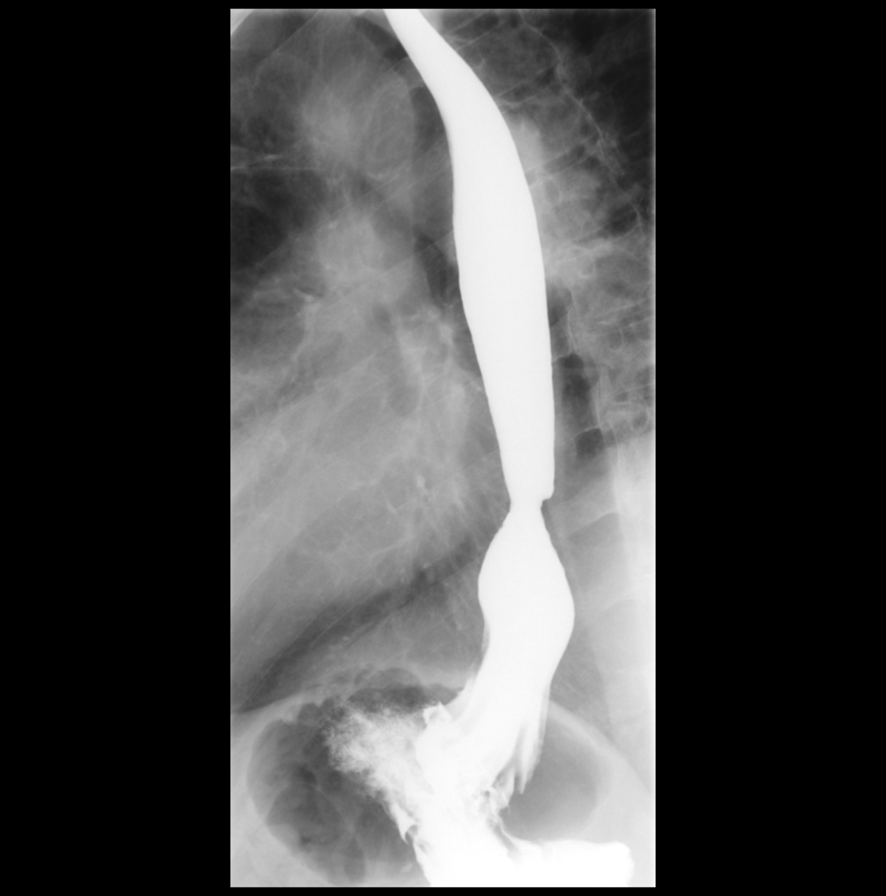

[Series 13: cp_standard · 0.26mm/px · 1 of 1 slices shown (5 of 7)]
[im 1/1]
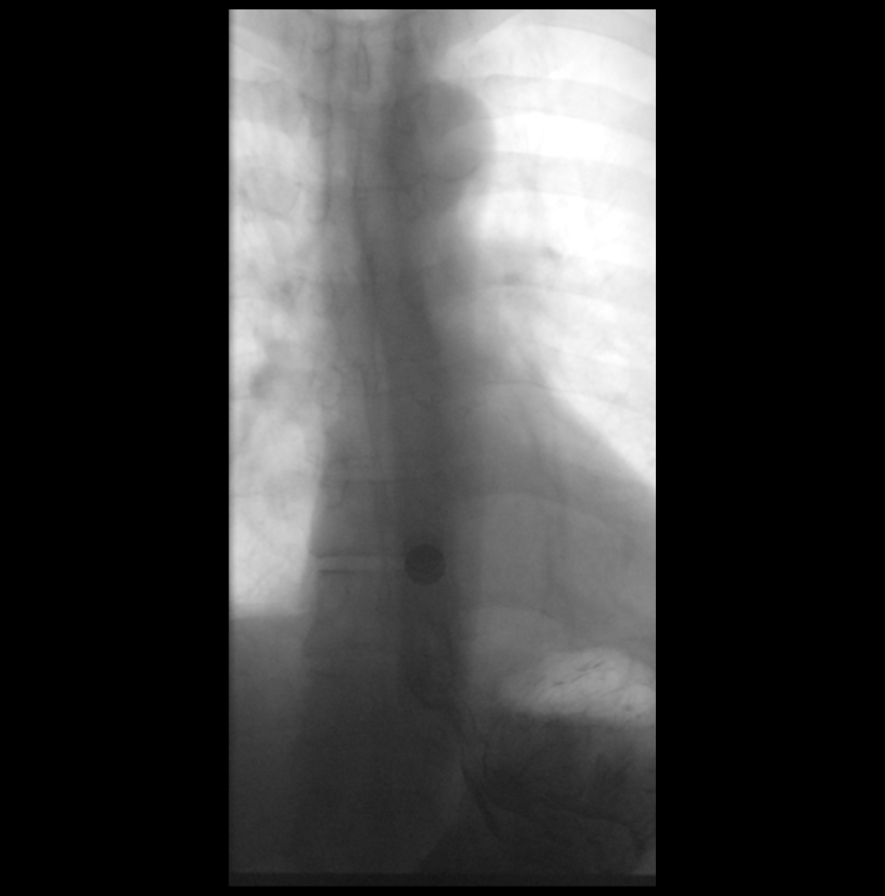

[Series 14: cp_standard · 0.52mm/px · 1 of 35 frames shown (6 of 7)]
[frame 6/35]
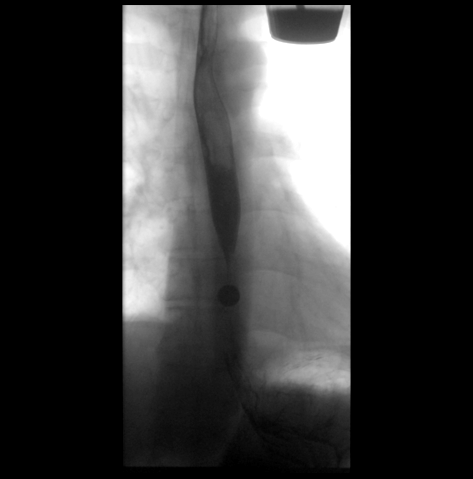

[Series 15: cp_standard · 0.26mm/px · 1 of 1 slices shown (7 of 7)]
[im 1/1]
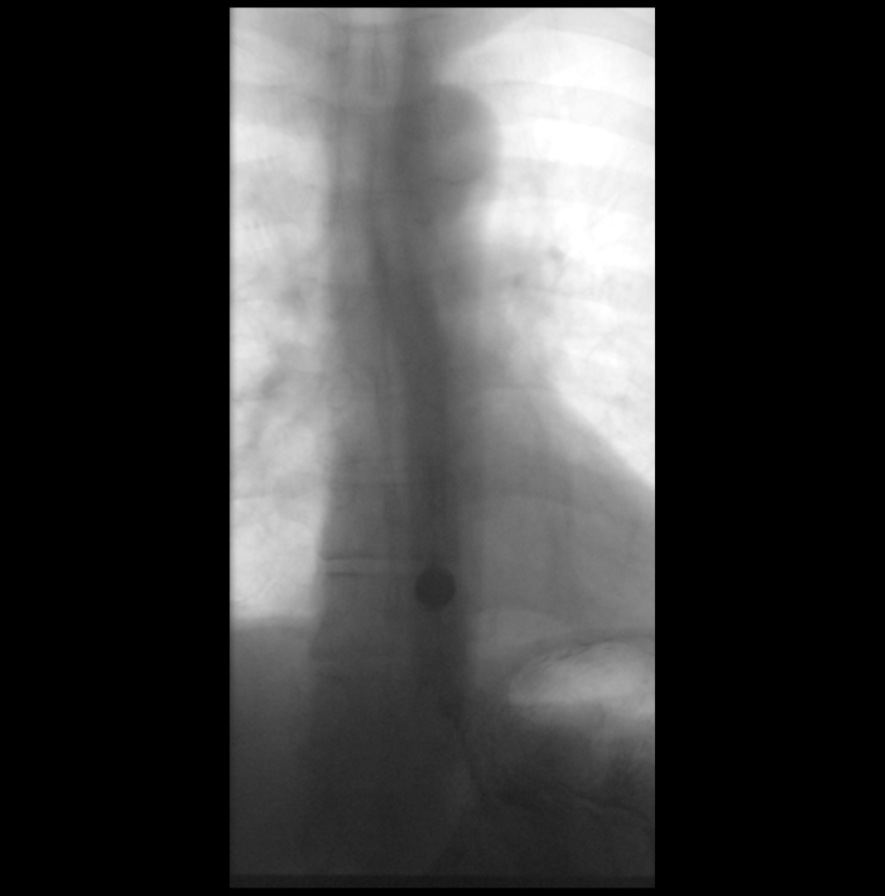

[12 of 24 positions shown; findings below may reference images not displayed]

FINDINGS: Double contrast esophagram demonstrated a normal appearance of the
esophageal mucosa. Multiple single swallow attempts demonstrated
failure to propagate several primary peristaltic waves, and even the
waves that did propagate where incompletely propagated. No tertiary
contractions were observed. No gastroesophageal reflux was observed
during the water siphon test. Full column esophagram demonstrated a
moderate-sized hiatal hernia with some evidence of narrowing of the
distal esophagus at or immediately above the level of the
gastroesophageal junction. A barium tablet was administered, which
failed to pass beyond this area of narrowing.
IMPRESSION: 1. Nonspecific esophageal motility disorder, as above.
2. Distal esophageal stricture at or immediately above the
gastroesophageal junction.
3. Moderate-sized hiatal hernia.

## 2019-08-20 MED FILL — FENOFIBRATE 160 MG TABLET: 160 | 90 days supply | Qty: 90 | Fill #0

## 2019-10-05 ENCOUNTER — Other Ambulatory Visit: Payer: Self-pay

## 2019-10-06 ENCOUNTER — Other Ambulatory Visit: Payer: Self-pay

## 2019-10-06 ENCOUNTER — Ambulatory Visit (INDEPENDENT_AMBULATORY_CARE_PROVIDER_SITE_OTHER): Payer: BLUE CROSS/BLUE SHIELD | Admitting: Internal Medicine

## 2019-10-06 ENCOUNTER — Encounter: Payer: Self-pay | Admitting: Internal Medicine

## 2019-10-06 VITALS — BP 136/81 | HR 93 | Temp 95.5°F | Resp 16 | Ht >= 80 in | Wt 239.1 lb

## 2019-10-06 DIAGNOSIS — E119 Type 2 diabetes mellitus without complications: Secondary | ICD-10-CM

## 2019-10-06 DIAGNOSIS — E781 Pure hyperglyceridemia: Secondary | ICD-10-CM | POA: Diagnosis not present

## 2019-10-06 LAB — LIPID PANEL
Cholesterol: 180 mg/dL (ref 0–200)
HDL: 31.6 mg/dL — ABNORMAL LOW (ref >39.00)
NonHDL: 148.29
Total CHOL/HDL Ratio: 6
Triglycerides: 353 mg/dL — ABNORMAL HIGH (ref 0.0–149.0)
VLDL: 70.6 mg/dL — ABNORMAL HIGH (ref 0.0–40.0)

## 2019-10-06 LAB — COMPREHENSIVE METABOLIC PANEL
ALT: 29 U/L (ref 0–53)
AST: 18 U/L (ref 0–37)
Albumin: 4.4 g/dL (ref 3.5–5.2)
Alkaline Phosphatase: 53 U/L (ref 39–117)
BUN: 18 mg/dL (ref 6–23)
CO2: 26 mEq/L (ref 19–32)
Calcium: 9.7 mg/dL (ref 8.4–10.5)
Chloride: 103 mEq/L (ref 96–112)
Creatinine, Ser: 1.02 mg/dL (ref 0.40–1.50)
GFR: 74.32 mL/min (ref >60.00)
Glucose, Bld: 138 mg/dL — ABNORMAL HIGH (ref 70–99)
Potassium: 4.6 mEq/L (ref 3.5–5.1)
Sodium: 139 mEq/L (ref 135–145)
Total Bilirubin: 0.5 mg/dL (ref 0.2–1.2)
Total Protein: 7.1 g/dL (ref 6.0–8.3)

## 2019-10-06 LAB — LDL CHOLESTEROL, DIRECT: Direct LDL: 104 mg/dL

## 2019-10-06 LAB — HEMOGLOBIN A1C: Hgb A1c MFr Bld: 7 % — ABNORMAL HIGH (ref 4.6–6.5)

## 2019-10-06 NOTE — Progress Notes (Signed)
Pre visit review using our clinic review tool, if applicable. No additional management support is needed unless otherwise documented below in the visit note. 

## 2019-10-06 NOTE — Progress Notes (Signed)
   Subjective:    Patient ID: Seth Pineda, male    DOB: 09/11/1960, 59 y.o.   MRN: [id number]  DOS:  10/06/2019 Type of visit - description: Routine follow-up Since the last office visit, cholesterol increased and A1c got into the diabetic range   Review of Systems Since then, good compliance with medication without apparent side effects, has modified his diet to some extent He is active playing with his dog 20 minutes twice a day.  Past Medical History:  Diagnosis Date  . Essential hypertension, benign 01/03/2009   Qualifier: Diagnosis of  By: Tawanna Cooler MD, Eugenio Hoes   . GERD (gastroesophageal reflux disease)    no longer on Pepcid    . Hyperlipidemia   . Hypertension     Past Surgical History:  Procedure Laterality Date  . COLONOSCOPY    . NO PAST SURGERIES          Objective:   Physical Exam BP 136/81 (BP Location: Left Arm, Patient Position: Sitting, Cuff Size: Normal)   Pulse 93   Temp (!) 95.5 F (35.3 C) (Temporal)   Resp 16   Ht 6\' 8"  (2.032 m)   Wt 239 lb 2 oz (108.5 kg)   SpO2 93%   BMI 26.27 kg/m  General:   Well developed, NAD, BMI noted. HEENT:  Normocephalic . Face symmetric, atraumatic  skin: Not pale. Not jaundice Neurologic:  alert & oriented X3.  Speech normal, gait appropriate for age and unassisted Psych--  Cognition and judgment appear intact.  Cooperative with normal attention span and concentration.  Behavior appropriate. No anxious or depressed appearing.      Assessment    ASSESSMENT DM A1c 6.5 (9-2 20) HTN dx in his 38 Dyslipidemia Back pain  GERD: EGD 10-2017, severe ulcerative esophagitis.  BX negative for H. pylori or dysplasia. E.D. h/o low testosterone   PLAN DM: Last A1c 6.5, discussed with the patient what the A1c means, we also talk about the role of diet, exercise and medications to control his blood sugar. Since the last visit he is doing slightly better with diet he remains relatively active. Plan: Check A1c and CMP.   Consider meds if A1c increase. HTN: Controlled High cholesterol:Last LDL 119, he is switch from simvastatin to atorvastatin, labs today.  LDL goal less than 100 RTC CPX 9-20 21  This visit occurred during the SARS-CoV-2 public health emergency.  Safety protocols were in place, including screening questions prior to the visit, additional usage of staff PPE, and extensive cleaning of exam room while observing appropriate contact time as indicated for disinfecting solutions.

## 2019-10-06 NOTE — Patient Instructions (Addendum)
Per our records you are due for an eye exam. Please contact your eye doctor to schedule an appointment. Please have them send copies of your office visit notes to [place]. Our fax number is [phone].   GO TO THE LAB : Get the blood work     GO TO THE FRONT DESK Come back for a physical exam by 9-20 21, please make an appointment if not already done    DIABETES self learn tools:  Get the book "Diabetes for Dummies" Get the book: "The [facility] Diabetes diet"   Get the book: "The Essential Diabetes Book" Online resources: The American diabetes Association     diabetes.org  Visit [facility] website it is a Chief Technology Officer  joslin.org  The [facility] web site has a diabetes section  PinkCheek.be

## 2019-10-07 NOTE — Assessment & Plan Note (Signed)
DM: Last A1c 6.5, discussed with the patient what the A1c means, we also talk about the role of diet, exercise and medications to control his blood sugar. Since the last visit he is doing slightly better with diet he remains relatively active. Plan: Check A1c and CMP.  Consider meds if A1c increase. HTN: Controlled High cholesterol:Last LDL 119, he is switch from simvastatin to atorvastatin, labs today.  LDL goal less than 100 RTC CPX 9-20 21

## 2019-10-08 ENCOUNTER — Encounter: Payer: Self-pay | Admitting: Internal Medicine

## 2019-10-08 MED ORDER — METFORMIN HCL 850 MG PO TABS: ORAL_TABLET | ORAL | 3 refills | Status: DC

## 2019-10-08 MED FILL — METFORMIN HCL 850 MG TABS: 850 | 30 days supply | Qty: 60 | Fill #0

## 2019-10-08 NOTE — Addendum Note (Signed)
Addended byConrad  D on: 10/08/2019 04:49 PM   Modules accepted: Orders

## 2019-10-11 ENCOUNTER — Telehealth: Payer: Self-pay | Admitting: Internal Medicine

## 2019-10-11 MED FILL — LISINOPRIL 5 MG TABLET: 5 | 90 days supply | Qty: 90 | Fill #1

## 2019-10-11 MED FILL — ATORVASTATIN 40 MG TABLET: 40 | 90 days supply | Qty: 90 | Fill #1

## 2019-10-11 NOTE — Telephone Encounter (Signed)
Called pt for a 3 month follow up. Pt stated he didn't have his calendar and  That he would go on my chart to schedule

## 2019-10-22 MED FILL — OMEPRAZOLE 40 MG CPDR: 40 | 90 days supply | Qty: 90 | Fill #2

## 2019-12-13 ENCOUNTER — Ambulatory Visit: Payer: BLUE CROSS/BLUE SHIELD | Attending: Internal Medicine

## 2019-12-13 DIAGNOSIS — Z23 Encounter for immunization: Secondary | ICD-10-CM

## 2019-12-13 NOTE — Progress Notes (Signed)
   Covid-19 Vaccination Clinic  Name:  Seth Pineda    MRN: [id number] DOB: 1960/11/29  12/13/2019  Seth Pineda was observed post Covid-19 immunization for 15 minutes without incident. He was provided with Vaccine Information Sheet and instruction to access the V-Safe system.   Seth Pineda was instructed to call 911 with any severe reactions post vaccine: Marland Kitchen Difficulty breathing  . Swelling of face and throat  . A fast heartbeat  . A bad rash all over body  . Dizziness and weakness   Immunizations Administered    Name Date Dose VIS Date Route   Pfizer COVID-19 Vaccine 12/13/2019 11:53 AM 0.3 mL 08/13/2019 Intramuscular   Manufacturer: [employer], [employer]   Lot: [id number]   NDC: [address]-7

## 2019-12-30 MED FILL — FENOFIBRATE 160 MG TABLET: 160 | 90 days supply | Qty: 90 | Fill #1

## 2020-01-03 ENCOUNTER — Ambulatory Visit: Payer: BLUE CROSS/BLUE SHIELD | Attending: Internal Medicine

## 2020-01-03 DIAGNOSIS — Z23 Encounter for immunization: Secondary | ICD-10-CM

## 2020-01-03 NOTE — Progress Notes (Signed)
   Covid-19 Vaccination Clinic  Name:  Seth Pineda    MRN: [id number] DOB: 1961/06/06  01/03/2020  [name] was observed post Covid-19 immunization for 15 minutes without incident. He was provided with Vaccine Information Sheet and instruction to access the V-Safe system.   [name] was instructed to call 911 with any severe reactions post vaccine: Marland Kitchen Difficulty breathing  . Swelling of face and throat  . A fast heartbeat  . A bad rash all over body  . Dizziness and weakness   Immunizations Administered    Name Date Dose VIS Date Route   Pfizer COVID-19 Vaccine 01/03/2020  1:45 PM 0.3 mL 10/27/2018 Intramuscular   Manufacturer: [employer], [employer]   Lot: [id number]   NDC: [address]-0

## 2020-03-09 ENCOUNTER — Encounter: Payer: Self-pay | Admitting: Internal Medicine

## 2020-09-12 ENCOUNTER — Other Ambulatory Visit (HOSPITAL_BASED_OUTPATIENT_CLINIC_OR_DEPARTMENT_OTHER): Payer: Self-pay | Admitting: Internal Medicine

## 2020-09-12 ENCOUNTER — Ambulatory Visit: Payer: Self-pay | Attending: Internal Medicine

## 2020-09-12 DIAGNOSIS — Z23 Encounter for immunization: Secondary | ICD-10-CM

## 2020-09-12 MED FILL — PFIZER-BIONTECH COVID-19 VA: 30 | 21 days supply | Qty: 0 | Fill #0

## 2020-09-12 NOTE — Progress Notes (Signed)
   Covid-19 Vaccination Clinic  Name:  Seth Pineda    MRN: [id number] DOB: Nov 01, 1960  09/12/2020  Seth Pineda was observed post Covid-19 immunization for 15 minutes without incident. He was provided with Vaccine Information Sheet and instruction to access the V-Safe system.   Seth Pineda was instructed to call 911 with any severe reactions post vaccine: Marland Kitchen Difficulty breathing  . Swelling of face and throat  . A fast heartbeat  . A bad rash all over body  . Dizziness and weakness   Immunizations Administered    Name Date Dose VIS Date Route   Pfizer COVID-19 Vaccine 09/12/2020  2:59 PM 0.3 mL 06/21/2020 Intramuscular   Manufacturer: [employer], [employer]   Lot: [id number]   NDC: [address]-0

## 2020-11-08 ENCOUNTER — Encounter: Payer: Self-pay | Admitting: Internal Medicine

## 2020-12-26 LAB — HM DIABETES EYE EXAM

## 2020-12-27 ENCOUNTER — Encounter: Payer: Self-pay | Admitting: Internal Medicine

## 2021-01-02 ENCOUNTER — Other Ambulatory Visit (HOSPITAL_BASED_OUTPATIENT_CLINIC_OR_DEPARTMENT_OTHER): Payer: Self-pay

## 2021-01-02 ENCOUNTER — Ambulatory Visit (INDEPENDENT_AMBULATORY_CARE_PROVIDER_SITE_OTHER): Payer: Commercial Managed Care - PPO | Admitting: Internal Medicine

## 2021-01-02 ENCOUNTER — Other Ambulatory Visit: Payer: Self-pay

## 2021-01-02 ENCOUNTER — Encounter: Payer: Self-pay | Admitting: Internal Medicine

## 2021-01-02 VITALS — BP 126/88 | HR 68 | Temp 97.8°F | Resp 16 | Ht >= 80 in | Wt 172.0 lb

## 2021-01-02 DIAGNOSIS — I10 Essential (primary) hypertension: Secondary | ICD-10-CM

## 2021-01-02 DIAGNOSIS — Z0001 Encounter for general adult medical examination with abnormal findings: Secondary | ICD-10-CM

## 2021-01-02 DIAGNOSIS — Z0000 Encounter for general adult medical examination without abnormal findings: Secondary | ICD-10-CM | POA: Diagnosis not present

## 2021-01-02 DIAGNOSIS — E1165 Type 2 diabetes mellitus with hyperglycemia: Secondary | ICD-10-CM

## 2021-01-02 DIAGNOSIS — Z125 Encounter for screening for malignant neoplasm of prostate: Secondary | ICD-10-CM | POA: Diagnosis not present

## 2021-01-02 DIAGNOSIS — Z23 Encounter for immunization: Secondary | ICD-10-CM | POA: Diagnosis not present

## 2021-01-02 DIAGNOSIS — E781 Pure hyperglyceridemia: Secondary | ICD-10-CM | POA: Diagnosis not present

## 2021-01-02 LAB — CBC WITH DIFFERENTIAL/PLATELET
Basophils Absolute: 0.1 10*3/uL (ref 0.0–0.1)
Basophils Relative: 0.9 % (ref 0.0–3.0)
Eosinophils Absolute: 0.1 10*3/uL (ref 0.0–0.7)
Eosinophils Relative: 2 % (ref 0.0–5.0)
HCT: 45.8 % (ref 39.0–52.0)
Hemoglobin: 15.6 g/dL (ref 13.0–17.0)
Lymphocytes Relative: 35.9 % (ref 12.0–46.0)
Lymphs Abs: 2.6 10*3/uL (ref 0.7–4.0)
MCHC: 34 g/dL (ref 30.0–36.0)
MCV: 90.2 fl (ref 78.0–100.0)
Monocytes Absolute: 0.5 10*3/uL (ref 0.1–1.0)
Monocytes Relative: 6.9 % (ref 3.0–12.0)
Neutro Abs: 4 10*3/uL (ref 1.4–7.7)
Neutrophils Relative %: 54.3 % (ref 43.0–77.0)
Platelets: 236 10*3/uL (ref 150.0–400.0)
RBC: 5.09 Mil/uL (ref 4.22–5.81)
RDW: 13.3 % (ref 11.5–15.5)
WBC: 7.3 10*3/uL (ref 4.0–10.5)

## 2021-01-02 LAB — COMPREHENSIVE METABOLIC PANEL
ALT: 17 U/L (ref 0–53)
AST: 10 U/L (ref 0–37)
Albumin: 4.4 g/dL (ref 3.5–5.2)
Alkaline Phosphatase: 73 U/L (ref 39–117)
BUN: 15 mg/dL (ref 6–23)
CO2: 24 mEq/L (ref 19–32)
Calcium: 9.8 mg/dL (ref 8.4–10.5)
Chloride: 97 mEq/L (ref 96–112)
Creatinine, Ser: 0.72 mg/dL (ref 0.40–1.50)
GFR: 98.29 mL/min (ref >60.00)
Glucose, Bld: 431 mg/dL — ABNORMAL HIGH (ref 70–99)
Potassium: 4.2 mEq/L (ref 3.5–5.1)
Sodium: 132 mEq/L — ABNORMAL LOW (ref 135–145)
Total Bilirubin: 0.6 mg/dL (ref 0.2–1.2)
Total Protein: 6.9 g/dL (ref 6.0–8.3)

## 2021-01-02 LAB — MICROALBUMIN / CREATININE URINE RATIO
Creatinine,U: 58.5 mg/dL
Microalb Creat Ratio: 1.2 mg/g (ref 0.0–30.0)
Microalb, Ur: <0.7 mg/dL (ref 0.0–1.9)

## 2021-01-02 LAB — TSH: TSH: 2.75 u[IU]/mL (ref 0.35–4.50)

## 2021-01-02 LAB — POCT GLUCOSE (DEVICE FOR HOME USE): Glucose Fasting, POC: 443 mg/dL — AB (ref 70–99)

## 2021-01-02 LAB — LDL CHOLESTEROL, DIRECT: Direct LDL: 108 mg/dL

## 2021-01-02 LAB — PSA: PSA: 0.29 ng/mL (ref 0.10–4.00)

## 2021-01-02 LAB — LIPID PANEL
Cholesterol: 268 mg/dL — ABNORMAL HIGH (ref 0–200)
HDL: 33.3 mg/dL — ABNORMAL LOW
Total CHOL/HDL Ratio: 8
Triglycerides: 740 mg/dL — ABNORMAL HIGH (ref 0.0–149.0)

## 2021-01-02 LAB — HEMOGLOBIN A1C: Hgb A1c MFr Bld: 15.3 % — ABNORMAL HIGH (ref 4.6–6.5)

## 2021-01-02 MED ORDER — METFORMIN HCL 850 MG PO TABS
850.0000 mg | ORAL_TABLET | Freq: Two times a day (BID) | ORAL | 1 refills | Status: DC
  Filled 2021-01-02: qty 60, 30d supply, fill #0
  Filled 2021-02-06: qty 60, 30d supply, fill #1

## 2021-01-02 MED ORDER — GLIMEPIRIDE 2 MG PO TABS
2.0000 mg | ORAL_TABLET | Freq: Every day | ORAL | 1 refills | Status: DC
  Filled 2021-01-02: qty 30, 30d supply, fill #0

## 2021-01-02 NOTE — Assessment & Plan Note (Signed)
Here for CPX Med reconciliation: Not taking metformin, Lipitor, fenofibrate, lisinopril, Sildenafil, omega-3 fatty acid DM: Not taking any medication, CBG today at the office in the 400s. Will check labs including A1c, glimepiride 2 mg, restart metformin, see AVS.  Check CBGs at home, reassess in 6 weeks HTN: On no medications, BP is okay today. High cholesterol: Used to take Lipitor and fenofibrate, checking labs, further advised with results. Weight loss: Likely due to uncontrolled DM.  General labs to be checked today. Addendum: Labs reviewed, triglycerides in the 700s, A1c 15.3. RTC 6 weeks

## 2021-01-02 NOTE — Patient Instructions (Addendum)
Diabetes:  -Were checking your blood today -Will restart metformin 850 mg: Half tablet twice daily for 1 week, then 1 tablet twice daily -Glimepiride 2 mg: 1 tablet in the morning with breakfast.  Do not take if you won't eat. -Check your blood sugars at different times of the day -GOALS: Fasting before a meal 70- 130 2 hours after a meal less than 180   High cholesterol: Were checking your blood today Will restart medications with results   Check the  blood pressure weekly BP GOAL is between 110/65 and  135/85. If it is consistently higher or lower, let me know  Proceed with the COVID-vaccine #4 at your convenience     GO TO THE LAB : Get the blood work     GO TO THE FRONT DESK, PLEASE SCHEDULE YOUR APPOINTMENTS Come back for a checkup in 6 weeks

## 2021-01-02 NOTE — Progress Notes (Signed)
Subjective:    Patient ID: Seth Pineda, male    DOB: 1960-10-27, 59 y.o.   MRN: [id number]  DOS:  01/02/2021 Type of visit - description: CPX  Here for CPX but his main concern is weight loss. This is started several months ago but has worsened for the last 2 months. He is afraid it may be from hyperglycemia because he is having polyuria and polydipsia. Has not checked his ambulatory CBGs Quit his medications many months ago.    Wt Readings from Last 3 Encounters:  01/02/21 172 lb (78 kg)  10/06/19 239 lb 2 oz (108.5 kg)  06/01/19 235 lb 8 oz (106.8 kg)     Review of Systems Specifically denies fever chills.  No headaches, no myalgias. No chest pain no difficulty breathing No nausea, vomiting, diarrhea.  No blood in the stools. No LUTS. No palpitations but feels somewhat anxious  Other than above, a 14 point review of systems is negative      Past Medical History:  Diagnosis Date  . Essential hypertension, benign 01/03/2009   Qualifier: Diagnosis of  By: Tawanna Cooler MD, Eugenio Hoes   . GERD (gastroesophageal reflux disease)    no longer on Pepcid    . Hyperlipidemia   . Hypertension     Social History   Socioeconomic History  . Marital status: Married    Spouse name: Not on file  . Number of children: 1  . Years of education: Not on file  . Highest education level: Not on file  Occupational History  . 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 Smoking status: Former Smoker    Quit date: 05/31/1999    Years since quitting: 21.6  . Smokeless tobacco: Never Used  Substance and Sexual Activity  . Alcohol use: Yes    Comment: occasional beer  . Drug use: No  . Sexual activity: Not on file  Other Topics Concern  . Not on file  Social History Narrative   Wife is a Charity fundraiser   Child in college   Social Determinants of Health   Financial Resource Strain: Not on file  Food Insecurity: Not on file  Transportation Needs: Not on file  Physical Activity: Not  on file  Stress: Not on file  Social Connections: Not on file  Intimate Partner Violence: Not on file    Family History  Problem Relation Age of Onset  . Hyperlipidemia Other   . Hypertension Other   . CAD Father 5  . Dementia Mother   . Colon cancer Neg Hx   . Rectal cancer Neg Hx   . Stomach cancer Neg Hx   . Colon polyps Neg Hx   . Esophageal cancer Neg Hx   . Diabetes Neg Hx   . Prostate cancer Neg Hx     Past Surgical History:  Procedure Laterality Date  . NO PAST SURGERIES      Allergies as of 01/02/2021   No Known Allergies     Medication List       Accurate as of Jan 02, 2021  5:52 PM. If you have any questions, ask your nurse or doctor.        STOP taking these medications   atorvastatin 40 MG tablet Commonly known as: LIPITOR Stopped by: Willow Ora, MD   cyclobenzaprine 5 MG tablet Commonly known as: FLEXERIL Stopped by: Willow Ora, MD   fenofibrate 160 MG tablet Stopped by: Willow Ora, MD   fish oil-omega-3  fatty acids 1000 MG capsule Stopped by: Willow Ora, MD   lisinopril 5 MG tablet Commonly known as: ZESTRIL Stopped by: Willow Ora, MD   Pfizer-BioNTech COVID-19 Vacc 30 MCG/0.3ML injection Generic drug: COVID-19 mRNA vaccine Proofreader) Stopped by: Willow Ora, MD   sildenafil 20 MG tablet Commonly known as: Revatio Stopped by: Willow Ora, MD     TAKE these medications   glimepiride 2 MG tablet Commonly known as: AMARYL Take 1 tablet (2 mg total) by mouth daily before breakfast. Started by: Willow Ora, MD   metFORMIN 850 MG tablet Commonly known as: Glucophage Take 1 tablet (850 mg total) by mouth 2 (two) times daily with a meal. What changed:   how much to take  how to take this  when to take this  additional instructions Changed by: Willow Ora, MD   omeprazole 40 MG capsule Commonly known as: PRILOSEC Take 1 capsule (40 mg total) by mouth daily before breakfast.          Objective:   Physical Exam BP 126/88 (BP Location: Left Arm,  Patient Position: Sitting, Cuff Size: Small)   Pulse 68   Temp 97.8 F (36.6 C) (Oral)   Resp 16   Ht 6\' 8"  (2.032 m)   Wt 172 lb (78 kg)   SpO2 98%   BMI 18.90 kg/m  General: Well developed, NAD, BMI is low but he does not look chronically ill or toxic. Neck: No  thyromegaly  HEENT:  Normocephalic . Face symmetric, atraumatic Lungs:  CTA B Normal respiratory effort, no intercostal retractions, no accessory muscle use. Heart: RRR,  no murmur.  Abdomen:  Not distended, soft, non-tender. No rebound or rigidity.   Lower extremities: no pretibial edema bilaterally DRE: Normal sphincter tone, no stools.  Prostate normal Skin: Exposed areas without rash. Not pale. Not jaundice Neurologic:  alert & oriented X3.  Speech normal, gait appropriate for age and unassisted Strength symmetric and appropriate for age.  Psych: Cognition and judgment appear intact.  Cooperative with normal attention span and concentration.  Behavior appropriate. No anxious or depressed appearing.     Assessment      ASSESSMENT DM A1c 6.5 (9-2 20) HTN dx in his 38 Dyslipidemia Back pain  GERD: EGD 10-2017, severe ulcerative esophagitis.  BX negative for H. pylori or dysplasia. E.D. h/o low testosterone   PLAN Here for CPX Med reconciliation: Not taking metformin, Lipitor, fenofibrate, lisinopril, Sildenafil, omega-3 fatty acid DM: Not taking any medication, CBG today at the office in the 400s. Will check labs including A1c, glimepiride 2 mg, restart metformin, see AVS.  Check CBGs at home, reassess in 6 weeks HTN: On no medications, BP is okay today. High cholesterol: Used to take Lipitor and fenofibrate, checking labs, further advised with results. Weight loss: Likely due to uncontrolled DM.  General labs to be checked today. Addendum: Labs reviewed, triglycerides in the 700s, A1c 15.3. RTC 6 weeks  In addition to CPX, we addressed today his uncontrolled diabetes and dyslipidemia.  This visit  occurred during the SARS-CoV-2 public health emergency.  Safety protocols were in place, including screening questions prior to the visit, additional usage of staff PPE, and extensive cleaning of exam room while observing appropriate contact time as indicated for disinfecting solutions.

## 2021-01-02 NOTE — Assessment & Plan Note (Signed)
-   Td 2020 - PNM 23: Today - shingrex d/w pt before - covid vax x 3, rec booster - CCS: cscope 2012, will be due October 2022, patient aware - prostate ca screen: DRE normal, check PSA - Diet and exercise discussed - Labs:  CMP, FLP, CBC, A1c, micro, TSH, PSA

## 2021-01-03 ENCOUNTER — Other Ambulatory Visit: Payer: Self-pay

## 2021-01-03 ENCOUNTER — Other Ambulatory Visit (HOSPITAL_BASED_OUTPATIENT_CLINIC_OR_DEPARTMENT_OTHER): Payer: Self-pay

## 2021-01-03 MED ORDER — FENOFIBRATE 160 MG PO TABS
160.0000 mg | ORAL_TABLET | Freq: Every day | ORAL | 0 refills | Status: DC
  Filled 2021-01-03: qty 30, 30d supply, fill #0
  Filled 2021-02-06: qty 30, 30d supply, fill #1
  Filled 2021-03-12: qty 30, 30d supply, fill #2

## 2021-01-03 MED ORDER — GLIMEPIRIDE 4 MG PO TABS
4.0000 mg | ORAL_TABLET | Freq: Every day | ORAL | 0 refills | Status: DC
  Filled 2021-01-03: qty 30, 30d supply, fill #0
  Filled 2021-02-06: qty 30, 30d supply, fill #1

## 2021-01-04 ENCOUNTER — Other Ambulatory Visit (HOSPITAL_BASED_OUTPATIENT_CLINIC_OR_DEPARTMENT_OTHER): Payer: Self-pay

## 2021-01-20 ENCOUNTER — Encounter: Payer: Self-pay | Admitting: Internal Medicine

## 2021-01-22 ENCOUNTER — Other Ambulatory Visit (HOSPITAL_BASED_OUTPATIENT_CLINIC_OR_DEPARTMENT_OTHER): Payer: Self-pay

## 2021-01-22 ENCOUNTER — Other Ambulatory Visit: Payer: Self-pay

## 2021-01-22 MED ORDER — ONETOUCH ULTRA VI STRP
ORAL_STRIP | 0 refills | Status: DC
  Filled 2021-01-22: qty 50, 30d supply, fill #0

## 2021-01-23 ENCOUNTER — Other Ambulatory Visit (HOSPITAL_BASED_OUTPATIENT_CLINIC_OR_DEPARTMENT_OTHER): Payer: Self-pay

## 2021-01-23 MED ORDER — MICROLET LANCETS MISC
0 refills | Status: DC
  Filled 2021-01-23: qty 100, 30d supply, fill #0

## 2021-01-23 MED ORDER — GLUCOSE BLOOD VI STRP
ORAL_STRIP | 0 refills | Status: DC
  Filled 2021-01-23: qty 50, 30d supply, fill #0

## 2021-01-23 MED ORDER — CONTOUR NEXT EZ W/DEVICE KIT
PACK | 0 refills | Status: DC
  Filled 2021-01-23: qty 1, 1d supply, fill #0

## 2021-02-06 ENCOUNTER — Other Ambulatory Visit (HOSPITAL_BASED_OUTPATIENT_CLINIC_OR_DEPARTMENT_OTHER): Payer: Self-pay

## 2021-02-15 ENCOUNTER — Ambulatory Visit (INDEPENDENT_AMBULATORY_CARE_PROVIDER_SITE_OTHER): Payer: Commercial Managed Care - PPO | Admitting: Internal Medicine

## 2021-02-15 ENCOUNTER — Other Ambulatory Visit: Payer: Self-pay

## 2021-02-15 ENCOUNTER — Other Ambulatory Visit (HOSPITAL_BASED_OUTPATIENT_CLINIC_OR_DEPARTMENT_OTHER): Payer: Self-pay

## 2021-02-15 ENCOUNTER — Encounter: Payer: Self-pay | Admitting: Internal Medicine

## 2021-02-15 VITALS — BP 134/86 | HR 78 | Temp 98.4°F | Resp 16 | Ht >= 80 in | Wt 208.2 lb

## 2021-02-15 DIAGNOSIS — E119 Type 2 diabetes mellitus without complications: Secondary | ICD-10-CM

## 2021-02-15 LAB — BASIC METABOLIC PANEL
BUN: 15 mg/dL (ref 6–23)
CO2: 29 mEq/L (ref 19–32)
Calcium: 9.8 mg/dL (ref 8.4–10.5)
Chloride: 104 mEq/L (ref 96–112)
Creatinine, Ser: 0.74 mg/dL (ref 0.40–1.50)
GFR: 97.4 mL/min (ref >60.00)
Glucose, Bld: 126 mg/dL — ABNORMAL HIGH (ref 70–99)
Potassium: 4.2 mEq/L (ref 3.5–5.1)
Sodium: 140 mEq/L (ref 135–145)

## 2021-02-15 MED ORDER — METFORMIN HCL 1000 MG PO TABS
1000.0000 mg | ORAL_TABLET | Freq: Two times a day (BID) | ORAL | 0 refills | Status: DC
  Filled 2021-02-15: qty 60, 30d supply, fill #0
  Filled 2021-04-19: qty 60, 30d supply, fill #1
  Filled 2021-05-21: qty 60, 30d supply, fill #2

## 2021-02-15 NOTE — Progress Notes (Signed)
Subjective:    Patient ID: Seth Pineda, male    DOB: 1961-09-02, 60 y.o.   MRN: [id number]  DOS:  02/15/2021 Type of visit - description: F/U  F/u DM, high TG Since the last office visit is doing well, good medication compliance. Denies nausea, vomiting. No blurred vision No abdominal pain. Weight has increased  Wt Readings from Last 3 Encounters:  02/15/21 208 lb 4 oz (94.5 kg)  01/02/21 172 lb (78 kg)  10/06/19 239 lb 2 oz (108.5 kg)     Review of Systems See above   Past Medical History:  Diagnosis Date   Essential hypertension, benign 01/03/2009   Qualifier: Diagnosis of  By: Sherren Mocha MD, Dellis Filbert A    GERD (gastroesophageal reflux disease)    no longer on Pepcid     Hyperlipidemia    Hypertension     Past Surgical History:  Procedure Laterality Date   NO PAST SURGERIES      Allergies as of 02/15/2021   No Known Allergies      Medication List        Accurate as of February 15, 2021 11:59 PM. If you have any questions, ask your nurse or doctor.          Contour Next EZ w/Device Kit Use as directed once daily   Contour Next Test test strip Generic drug: glucose blood Use as directed once daily   fenofibrate 160 MG tablet Take 1 tablet (160 mg total) by mouth daily.   glimepiride 4 MG tablet Commonly known as: AMARYL Take 1 tablet (4 mg total) by mouth daily before breakfast.   metFORMIN 1000 MG tablet Commonly known as: GLUCOPHAGE Take 1 tablet (1,000 mg total) by mouth 2 (two) times daily with a meal. What changed:  medication strength how much to take Changed by: Kathlene November, MD   Microlet Lancets Misc Use as directed once daily   omeprazole 40 MG capsule Commonly known as: PRILOSEC Take 1 capsule (40 mg total) by mouth daily before breakfast.           Objective:   Physical Exam BP 134/86 (BP Location: Right Arm, Patient Position: Sitting, Cuff Size: Small)   Pulse 78   Temp 98.4 F (36.9 C) (Oral)   Resp 16   Ht 6' 8"  (2.032  m)   Wt 208 lb 4 oz (94.5 kg)   SpO2 98%   BMI 22.88 kg/m  General:   Well developed, NAD, BMI noted. HEENT:  Normocephalic . Face symmetric, atraumatic DM foot exam: No edema, good pedal pulses, pinprick examination normal Skin: Not pale. Not jaundice Neurologic:  alert & oriented X3.  Speech normal, gait appropriate for age and unassisted Psych--  Cognition and judgment appear intact.  Cooperative with normal attention span and concentration.  Behavior appropriate. No anxious or depressed appearing.      Assessment      ASSESSMENT DM A1c 6.5 (9-2 20) HTN dx in his 38 Dyslipidemia Back pain  GERD: EGD 10-2017, severe ulcerative esophagitis.  BX negative for H. pylori or dysplasia. E.D. h/o low testosterone   PLAN DM: Last A1c was around 15 in the context of not taking his medication.  Started glimepiride and metformin 850 mg twice daily. Good compliance, ambulatory CBGs around 150 improving gradually. Foot exam normal today. Plan: Check BMP, increase metformin to 1000 mg twice daily, decrease glimepiride to 2 mg daily when/if CBGs start to be consistently  in the low side, less than 100.  Weight gain noted, as expected. RTC  3 months   This visit occurred during the SARS-CoV-2 public health emergency.  Safety protocols were in place, including screening questions prior to the visit, additional usage of staff PPE, and extensive cleaning of exam room while observing appropriate contact time as indicated for disinfecting solutions.

## 2021-02-15 NOTE — Patient Instructions (Signed)
Increase metformin to 1000 mg twice daily  Continue glimepiride 4 mg. At some point if your blood sugar is consistently less than 100 decrease glimepiride to half tablet only   GO TO THE LAB : Get the blood work     GO TO THE FRONT DESK, PLEASE SCHEDULE YOUR APPOINTMENTS Come back for a checkup in 3 months

## 2021-02-17 NOTE — Assessment & Plan Note (Signed)
DM: Last A1c was around 15 in the context of not taking his medication.  Started glimepiride and metformin 850 mg twice daily. Good compliance, ambulatory CBGs around 150 improving gradually. Foot exam normal today. Plan: Check BMP, increase metformin to 1000 mg twice daily, decrease glimepiride to 2 mg daily when/if CBGs start to be consistently  in the low side, less than 100. Weight gain noted, as expected. RTC  3 months

## 2021-03-12 ENCOUNTER — Encounter: Payer: Self-pay | Admitting: Internal Medicine

## 2021-03-12 ENCOUNTER — Other Ambulatory Visit (HOSPITAL_BASED_OUTPATIENT_CLINIC_OR_DEPARTMENT_OTHER): Payer: Self-pay

## 2021-04-19 ENCOUNTER — Other Ambulatory Visit (HOSPITAL_BASED_OUTPATIENT_CLINIC_OR_DEPARTMENT_OTHER): Payer: Self-pay

## 2021-04-19 ENCOUNTER — Other Ambulatory Visit: Payer: Self-pay | Admitting: Internal Medicine

## 2021-04-19 MED ORDER — FENOFIBRATE 160 MG PO TABS
160.0000 mg | ORAL_TABLET | Freq: Every day | ORAL | 1 refills | Status: DC
  Filled 2021-04-19: qty 90, 90d supply, fill #0
  Filled 2021-07-18: qty 90, 90d supply, fill #1

## 2021-05-03 ENCOUNTER — Other Ambulatory Visit (HOSPITAL_BASED_OUTPATIENT_CLINIC_OR_DEPARTMENT_OTHER): Payer: Self-pay

## 2021-05-11 ENCOUNTER — Other Ambulatory Visit (HOSPITAL_BASED_OUTPATIENT_CLINIC_OR_DEPARTMENT_OTHER): Payer: Self-pay

## 2021-05-18 ENCOUNTER — Other Ambulatory Visit: Payer: Self-pay

## 2021-05-18 ENCOUNTER — Encounter: Payer: Self-pay | Admitting: Internal Medicine

## 2021-05-18 ENCOUNTER — Ambulatory Visit: Payer: 59 | Admitting: Internal Medicine

## 2021-05-18 VITALS — BP 126/86 | HR 74 | Temp 98.0°F | Resp 18 | Ht >= 80 in | Wt 223.4 lb

## 2021-05-18 DIAGNOSIS — E1165 Type 2 diabetes mellitus with hyperglycemia: Secondary | ICD-10-CM

## 2021-05-18 DIAGNOSIS — E781 Pure hyperglyceridemia: Secondary | ICD-10-CM

## 2021-05-18 LAB — LDL CHOLESTEROL, DIRECT: Direct LDL: 158 mg/dL

## 2021-05-18 LAB — LIPID PANEL
Cholesterol: 224 mg/dL — ABNORMAL HIGH (ref 0–200)
HDL: 40.1 mg/dL (ref >39.00)
NonHDL: 184.38
Total CHOL/HDL Ratio: 6
Triglycerides: 242 mg/dL — ABNORMAL HIGH (ref 0.0–149.0)
VLDL: 48.4 mg/dL — ABNORMAL HIGH (ref 0.0–40.0)

## 2021-05-18 LAB — HEMOGLOBIN A1C: Hgb A1c MFr Bld: 6.4 % (ref 4.6–6.5)

## 2021-05-18 NOTE — Patient Instructions (Addendum)
Recommend to proceed with the following vaccines at your pharmacy:  Covid #4 Flu shot this fall   You are due for your repeat colonoscopy with Dr. Christella Hartigan 06/2021. They are scheduled out several months. Please call his office at [phone] to get on his schedule.      GO TO THE LAB : Get the blood work     GO TO THE FRONT DESK, PLEASE SCHEDULE YOUR APPOINTMENTS Come back for   a check up in a checkup in 4 months

## 2021-05-18 NOTE — Progress Notes (Signed)
Subjective:    Patient ID: Seth Pineda, male    DOB: [birth year], 60 y.o.   MRN: [id number]  DOS:  05/18/2021 Type of visit - description: Follow-up Today with talk about diabetes, high cholesterol. He is feeling great.   Wt Readings from Last 3 Encounters:  05/18/21 223 lb 6 oz (101.3 kg)  02/15/21 208 lb 4 oz (94.5 kg)  01/02/21 172 lb (78 kg)     Review of Systems See above   Past Medical History:  Diagnosis Date   Essential hypertension, benign 01/03/2009   Qualifier: Diagnosis of  By: Sherren Mocha MD, Dellis Filbert A    GERD (gastroesophageal reflux disease)    no longer on Pepcid     Hyperlipidemia    Hypertension     Past Surgical History:  Procedure Laterality Date   NO PAST SURGERIES      Allergies as of 05/18/2021   No Known Allergies      Medication List        Accurate as of May 18, 2021 11:59 PM. If you have any questions, ask your nurse or doctor.          STOP taking these medications    glimepiride 4 MG tablet Commonly known as: AMARYL Stopped by: Kathlene November, MD       TAKE these medications    Contour Next EZ w/Device Kit Use as directed once daily   Contour Next Test test strip Generic drug: glucose blood Use as directed once daily   fenofibrate 160 MG tablet Take 1 tablet (160 mg total) by mouth daily.   metFORMIN 1000 MG tablet Commonly known as: GLUCOPHAGE Take 1 tablet (1,000 mg total) by mouth 2 (two) times daily with a meal.   Microlet Lancets Misc Use as directed once daily   omeprazole 40 MG capsule Commonly known as: PRILOSEC Take 1 capsule (40 mg total) by mouth daily before breakfast.           Objective:   Physical Exam BP 126/86 (BP Location: Left Arm, Patient Position: Sitting, Cuff Size: Normal)   Pulse 74   Temp 98 F (36.7 C) (Oral)   Resp 18   Ht 6' 8" (2.032 m)   Wt 223 lb 6 oz (101.3 kg)   SpO2 98%   BMI 24.54 kg/m  General:   Well developed, NAD, BMI noted. HEENT:  Normocephalic . Face  symmetric, atraumatic Lungs:  CTA B Normal respiratory effort, no intercostal retractions, no accessory muscle use. Heart: RRR,  no murmur.  Lower extremities: no pretibial edema bilaterally  Skin: Not pale. Not jaundice Neurologic:  alert & oriented X3.  Speech normal, gait appropriate for age and unassisted Psych--  Cognition and judgment appear intact.  Cooperative with normal attention span and concentration.  Behavior appropriate. No anxious or depressed appearing.      Assessment    ASSESSMENT DM A1c 6.5 (9-2 20) HTN dx in his 38 Dyslipidemia Back pain  GERD: EGD 10-2017, severe ulcerative esophagitis.  BX negative for H. pylori or dysplasia. E.D. h/o low testosterone   PLAN DM: Since last visit, CBGs in the afternoon went down to the 80s and he felt unwell.  Stopped glimepiride, currently on metformin only, ambulatory CBGs in the morning run from 118 to 135.  Check A1c.  If not at goal,  Jardiance or Actos?Marland Kitchen Dyslipidemia: Last cholesterol was elevated in the context of poor control diabetes, on fenofibrate, recheck.  Also  if LDL not at goal consider  statins >>> pt aware. HTN: On no meds.  BP is good. Vaccine advise: Flu shot, COVID shot. RTC 4 months  This visit occurred during the SARS-CoV-2 public health emergency.  Safety protocols were in place, including screening questions prior to the visit, additional usage of staff PPE, and extensive cleaning of exam room while observing appropriate contact time as indicated for disinfecting solutions.

## 2021-05-19 NOTE — Assessment & Plan Note (Signed)
DM: Since last visit, CBGs in the afternoon went down to the 80s and he felt unwell.  Stopped glimepiride, currently on metformin only, ambulatory CBGs in the morning run from 118 to 135.  Check A1c.  If not at goal,  Jardiance or Actos?Marland Kitchen Dyslipidemia: Last cholesterol was elevated in the context of poor control diabetes, on fenofibrate, recheck.  Also  if LDL not at goal consider statins >>> pt aware. HTN: On no meds.  BP is good. Vaccine advise: Flu shot, COVID shot. RTC 4 months

## 2021-05-21 ENCOUNTER — Other Ambulatory Visit (HOSPITAL_BASED_OUTPATIENT_CLINIC_OR_DEPARTMENT_OTHER): Payer: Self-pay

## 2021-05-21 MED ORDER — ATORVASTATIN CALCIUM 20 MG PO TABS
20.0000 mg | ORAL_TABLET | Freq: Every day | ORAL | 1 refills | Status: DC
  Filled 2021-05-21: qty 90, 90d supply, fill #0
  Filled 2021-08-31: qty 90, 90d supply, fill #1

## 2021-05-21 NOTE — Addendum Note (Signed)
Addended byConrad [place] D on: 05/21/2021 08:01 AM   Modules accepted: Orders

## 2021-06-24 ENCOUNTER — Other Ambulatory Visit: Payer: Self-pay | Admitting: Internal Medicine

## 2021-06-25 ENCOUNTER — Other Ambulatory Visit (HOSPITAL_BASED_OUTPATIENT_CLINIC_OR_DEPARTMENT_OTHER): Payer: Self-pay

## 2021-06-25 MED ORDER — METFORMIN HCL 1000 MG PO TABS
1000.0000 mg | ORAL_TABLET | Freq: Two times a day (BID) | ORAL | 1 refills | Status: DC
  Filled 2021-06-25: qty 180, 90d supply, fill #0
  Filled 2021-09-29: qty 180, 90d supply, fill #1

## 2021-06-29 ENCOUNTER — Other Ambulatory Visit (HOSPITAL_BASED_OUTPATIENT_CLINIC_OR_DEPARTMENT_OTHER): Payer: Self-pay

## 2021-06-29 ENCOUNTER — Telehealth: Payer: 59 | Admitting: Physician Assistant

## 2021-06-29 DIAGNOSIS — J0140 Acute pansinusitis, unspecified: Secondary | ICD-10-CM

## 2021-06-29 MED ORDER — AMOXICILLIN-POT CLAVULANATE 875-125 MG PO TABS
1.0000 | ORAL_TABLET | Freq: Two times a day (BID) | ORAL | 0 refills | Status: DC
  Filled 2021-06-29: qty 14, 7d supply, fill #0

## 2021-06-29 NOTE — Progress Notes (Signed)

## 2021-07-03 ENCOUNTER — Encounter: Payer: Self-pay | Admitting: Gastroenterology

## 2021-07-19 ENCOUNTER — Other Ambulatory Visit (HOSPITAL_BASED_OUTPATIENT_CLINIC_OR_DEPARTMENT_OTHER): Payer: Self-pay

## 2021-08-31 ENCOUNTER — Other Ambulatory Visit (HOSPITAL_BASED_OUTPATIENT_CLINIC_OR_DEPARTMENT_OTHER): Payer: Self-pay

## 2021-09-18 ENCOUNTER — Encounter: Payer: Self-pay | Admitting: Internal Medicine

## 2021-09-18 ENCOUNTER — Ambulatory Visit: Payer: 59 | Admitting: Internal Medicine

## 2021-09-18 VITALS — BP 134/76 | HR 77 | Temp 97.9°F | Resp 16 | Ht >= 80 in | Wt 220.4 lb

## 2021-09-18 DIAGNOSIS — I10 Essential (primary) hypertension: Secondary | ICD-10-CM | POA: Diagnosis not present

## 2021-09-18 DIAGNOSIS — E781 Pure hyperglyceridemia: Secondary | ICD-10-CM | POA: Diagnosis not present

## 2021-09-18 DIAGNOSIS — Z1211 Encounter for screening for malignant neoplasm of colon: Secondary | ICD-10-CM

## 2021-09-18 DIAGNOSIS — E119 Type 2 diabetes mellitus without complications: Secondary | ICD-10-CM | POA: Diagnosis not present

## 2021-09-18 LAB — LIPID PANEL
Cholesterol: 191 mg/dL (ref 0–200)
HDL: 38.6 mg/dL — ABNORMAL LOW (ref >39.00)
LDL Cholesterol: 118 mg/dL — ABNORMAL HIGH (ref 0–99)
NonHDL: 152.21
Total CHOL/HDL Ratio: 5
Triglycerides: 169 mg/dL — ABNORMAL HIGH (ref 0.0–149.0)
VLDL: 33.8 mg/dL (ref 0.0–40.0)

## 2021-09-18 LAB — AST: AST: 21 U/L (ref 0–37)

## 2021-09-18 LAB — HEMOGLOBIN A1C: Hgb A1c MFr Bld: 6.3 % (ref 4.6–6.5)

## 2021-09-18 LAB — ALT: ALT: 27 U/L (ref 0–53)

## 2021-09-18 NOTE — Assessment & Plan Note (Signed)
DM: Last A1c 6.4, on metformin.  We talked about diet and exercise, reports he is doing well.  Check A1c High cholesterol: LDL September 2022 was 158, on fenofibrate, was recommended to add atorvastatin 20 mg daily which he is doing.  Plan: Check FLP, AST, ALT HTN: On no medicines, recommend to start checking ambulatory BPs. Preventive care: Due for a colonoscopy >> GI referral.  Had a flu shot, recommend a COVID booster. RTC 12/2021 physical

## 2021-09-18 NOTE — Progress Notes (Signed)
° °  Subjective:    Patient ID: Seth Pineda, male    DOB: May 13, 1961, 61 y.o.   MRN: [id number]  DOS:  09/18/2021 Type of visit - description: f/u  Since the last office visit is doing well, has no major concerns. Doing okay with diet and exercise. Does not check ambulatory BPs. Good med compliance. We reviewed his blood work together  BP Readings from Last 3 Encounters:  09/18/21 134/76  05/18/21 126/86  02/15/21 134/86    Review of Systems See above   Past Medical History:  Diagnosis Date   Essential hypertension, benign 01/03/2009   Qualifier: Diagnosis of  By: Sherren Mocha MD, Jory Ee    GERD (gastroesophageal reflux disease)    no longer on Pepcid     Hyperlipidemia    Hypertension     Past Surgical History:  Procedure Laterality Date   NO PAST SURGERIES      Current Outpatient Medications  Medication Instructions   amoxicillin-clavulanate (AUGMENTIN) 875-125 MG tablet 1 tablet, Oral, 2 times daily   atorvastatin (LIPITOR) 20 mg, Oral, Daily at bedtime   Blood Glucose Monitoring Suppl (CONTOUR NEXT EZ) w/Device KIT Use as directed once daily   fenofibrate 160 mg, Oral, Daily   glucose blood test strip Use as directed once daily   metFORMIN (GLUCOPHAGE) 1,000 mg, Oral, 2 times daily with meals   Microlet Lancets MISC Use as directed once daily   omeprazole (PRILOSEC) 40 mg, Oral, Daily before breakfast       Objective:   Physical Exam BP 134/76 (BP Location: Left Arm, Patient Position: Sitting, Cuff Size: Normal)    Pulse 77    Temp 97.9 F (36.6 C) (Oral)    Resp 16    Ht _0  (2.032 m)    Wt 220 lb 6 oz (100 kg)    SpO2 98%    BMI 24.21 kg/m  General:   Well developed, NAD, BMI noted. HEENT:  Normocephalic . Face symmetric, atraumatic Lungs:  CTA B Normal respiratory effort, no intercostal retractions, no accessory muscle use. Heart: RRR,  no murmur.  Lower extremities: no pretibial edema bilaterally  Skin: Not pale. Not jaundice Neurologic:  alert &  oriented X3.  Speech normal, gait appropriate for age and unassisted Psych--  Cognition and judgment appear intact.  Cooperative with normal attention span and concentration.  Behavior appropriate. No anxious or depressed appearing.      Assessment      ASSESSMENT DM A1c 6.5 (9-2 20) HTN dx in his 38 Dyslipidemia Back pain  GERD: EGD 10-2017, severe ulcerative esophagitis.  BX negative for H. pylori or dysplasia. E.D. h/o low testosterone   PLAN DM: Last A1c 6.4, on metformin.  We talked about diet and exercise, reports he is doing well.  Check A1c High cholesterol: LDL September 2022 was 158, on fenofibrate, was recommended to add atorvastatin 20 mg daily which he is doing.  Plan: Check FLP, AST, ALT HTN: On no medicines, recommend to start checking ambulatory BPs. Preventive care: Due for a colonoscopy >> GI referral.  Had a flu shot, recommend a COVID booster. RTC 12/2021 physical    This visit occurred during the SARS-CoV-2 public health emergency.  Safety protocols were in place, including screening questions prior to the visit, additional usage of staff PPE, and extensive cleaning of exam room while observing appropriate contact time as indicated for disinfecting solutions.

## 2021-09-18 NOTE — Patient Instructions (Addendum)
Check the  blood pressure regularly BP GOAL is between 110/65 and  135/85. If it is consistently higher or lower, let me know   Recommend to proceed with a colonoscopy  Recommend to proceed with a COVID booster   GO TO THE LAB : Get the blood work     GO TO THE FRONT DESK, PLEASE SCHEDULE YOUR APPOINTMENTS Come back for a physical exam by May 2023

## 2021-09-19 ENCOUNTER — Other Ambulatory Visit (HOSPITAL_BASED_OUTPATIENT_CLINIC_OR_DEPARTMENT_OTHER): Payer: Self-pay

## 2021-09-19 MED ORDER — ATORVASTATIN CALCIUM 40 MG PO TABS
40.0000 mg | ORAL_TABLET | Freq: Every day | ORAL | 1 refills | Status: DC
  Filled 2021-09-19: qty 90, 90d supply, fill #0
  Filled 2022-01-10: qty 90, 90d supply, fill #1

## 2021-09-19 NOTE — Addendum Note (Signed)
Addended byConrad [place] D on: 09/19/2021 01:11 PM   Modules accepted: Orders

## 2021-09-24 ENCOUNTER — Encounter: Payer: Self-pay | Admitting: Gastroenterology

## 2021-10-01 ENCOUNTER — Other Ambulatory Visit (HOSPITAL_BASED_OUTPATIENT_CLINIC_OR_DEPARTMENT_OTHER): Payer: Self-pay

## 2021-10-05 ENCOUNTER — Other Ambulatory Visit: Payer: Self-pay

## 2021-10-05 ENCOUNTER — Ambulatory Visit (AMBULATORY_SURGERY_CENTER): Payer: Self-pay

## 2021-10-05 ENCOUNTER — Other Ambulatory Visit (HOSPITAL_BASED_OUTPATIENT_CLINIC_OR_DEPARTMENT_OTHER): Payer: Self-pay

## 2021-10-05 VITALS — Ht >= 80 in | Wt 222.0 lb

## 2021-10-05 DIAGNOSIS — Z1211 Encounter for screening for malignant neoplasm of colon: Secondary | ICD-10-CM

## 2021-10-05 MED ORDER — NA SULFATE-K SULFATE-MG SULF 17.5-3.13-1.6 GM/177ML PO SOLN
1.0000 | Freq: Once | ORAL | 0 refills | Status: AC
  Filled 2021-10-05: qty 354, 1d supply, fill #0

## 2021-10-05 NOTE — Progress Notes (Signed)
Denies allergies to eggs or soy products. Denies complication of anesthesia or sedation. Denies use of weight loss medication. Denies use of O2.   Emmi instructions given for colonoscopy.  

## 2021-10-09 ENCOUNTER — Other Ambulatory Visit (HOSPITAL_BASED_OUTPATIENT_CLINIC_OR_DEPARTMENT_OTHER): Payer: Self-pay

## 2021-10-16 ENCOUNTER — Encounter: Payer: Self-pay | Admitting: Gastroenterology

## 2021-10-17 ENCOUNTER — Other Ambulatory Visit: Payer: Self-pay

## 2021-10-17 ENCOUNTER — Ambulatory Visit (AMBULATORY_SURGERY_CENTER): Payer: 59 | Admitting: Gastroenterology

## 2021-10-17 ENCOUNTER — Encounter: Payer: Self-pay | Admitting: Gastroenterology

## 2021-10-17 VITALS — BP 122/79 | HR 68 | Temp 97.0°F | Resp 13 | Ht >= 80 in | Wt 222.0 lb

## 2021-10-17 DIAGNOSIS — E119 Type 2 diabetes mellitus without complications: Secondary | ICD-10-CM | POA: Diagnosis not present

## 2021-10-17 DIAGNOSIS — K219 Gastro-esophageal reflux disease without esophagitis: Secondary | ICD-10-CM | POA: Diagnosis not present

## 2021-10-17 DIAGNOSIS — Z1211 Encounter for screening for malignant neoplasm of colon: Secondary | ICD-10-CM | POA: Diagnosis not present

## 2021-10-17 MED ORDER — SODIUM CHLORIDE 0.9 % IV SOLN: 500.0000 mL | Freq: Once | INTRAVENOUS | Status: DC

## 2021-10-17 NOTE — Op Note (Signed)
[facility] Patient Name: Seth Pineda Procedure Date: 10/17/2021 2:52 PM MRN: [id number] Endoscopist: Rachael Fee , MD Age: 61 Referring MD:  Date of Birth: July 15, 1961 Gender: Male Account #: [id number] Procedure:                Colonoscopy Indications:              Screening for colorectal malignant neoplasm Medicines:                Monitored Anesthesia Care Procedure:                Pre-Anesthesia Assessment:                           - Prior to the procedure, a History and Physical                            was performed, and patient medications and                            allergies were reviewed. The patient's tolerance of                            previous anesthesia was also reviewed. The risks                            and benefits of the procedure and the sedation                            options and risks were discussed with the patient.                            All questions were answered, and informed consent                            was obtained. Prior Anticoagulants: The patient has                            taken no previous anticoagulant or antiplatelet                            agents. ASA Grade Assessment: II - A patient with                            mild systemic disease. After reviewing the risks                            and benefits, the patient was deemed in                            satisfactory condition to undergo the procedure.                           After obtaining informed consent, the colonoscope  was passed under direct vision. Throughout the                            procedure, the patient's blood pressure, pulse, and                            oxygen saturations were monitored continuously. The                            Olympus CF-HQ190L [id number]) Colonoscope was                            introduced through the anus and advanced to the the                            cecum, identified  by appendiceal orifice and                            ileocecal valve. The colonoscopy was performed                            without difficulty. The patient tolerated the                            procedure well. The quality of the bowel                            preparation was good. The ileocecal valve,                            appendiceal orifice, and rectum were photographed. Scope In: 2:58:59 PM Scope Out: 3:10:07 PM Scope Withdrawal Time: 0 hours 7 minutes 52 seconds  Total Procedure Duration: 0 hours 11 minutes 8 seconds  Findings:                 Multiple small and large-mouthed diverticula were                            found in the left colon.                           Internal hemorrhoids were found. The hemorrhoids                            were small.                           The exam was otherwise without abnormality on                            direct and retroflexion views. Complications:            No immediate complications. Estimated blood loss:                            None. Estimated Blood Loss:  Estimated blood loss: none. Impression:               - Diverticulosis in the left colon.                           - Internal hemorrhoids.                           - The examination was otherwise normal on direct                            and retroflexion views.                           - No polyps or cancers. Recommendation:           - Patient has a contact number available for                            emergencies. The signs and symptoms of potential                            delayed complications were discussed with the                            patient. Return to normal activities tomorrow.                            Written discharge instructions were provided to the                            patient.                           - Resume previous diet.                           - Continue present medications.                           - Repeat  colonoscopy in 10 years for screening. Rachael Fee, MD 10/17/2021 3:13:02 PM This report has been signed electronically.

## 2021-10-17 NOTE — Progress Notes (Signed)
Report to PACU, RN, vss, BBS= Clear.  

## 2021-10-17 NOTE — Progress Notes (Signed)
Pt's states no medical or surgical changes since previsit or office visit. 

## 2021-10-17 NOTE — Patient Instructions (Signed)
YOU HAD AN ENDOSCOPIC PROCEDURE TODAY AT THE [facility]:   Refer to the procedure report that was given to you for any specific questions about what was found during the examination.  If the procedure report does not answer your questions, please call your gastroenterologist to clarify.  If you requested that your care partner not be given the details of your procedure findings, then the procedure report has been included in a sealed envelope for you to review at your convenience later. ° °YOU SHOULD EXPECT: Some feelings of bloating in the abdomen. Passage of more gas than usual.  Walking can help get rid of the air that was put into your GI tract during the procedure and reduce the bloating. If you had a lower endoscopy (such as a colonoscopy or flexible sigmoidoscopy) you may notice spotting of blood in your stool or on the toilet paper. If you underwent a bowel prep for your procedure, you may not have a normal bowel movement for a few days. ° °Please Note:  You might notice some irritation and congestion in your nose or some drainage.  This is from the oxygen used during your procedure.  There is no need for concern and it should clear up in a day or so. ° °SYMPTOMS TO REPORT IMMEDIATELY: ° °Following lower endoscopy (colonoscopy or flexible sigmoidoscopy): ° Excessive amounts of blood in the stool ° Significant tenderness or worsening of abdominal pains ° Swelling of the abdomen that is new, acute ° Fever of 100°F or higher ° °For urgent or emergent issues, a gastroenterologist can be reached at any hour by calling [phone]. °Do not use MyChart messaging for urgent concerns.  ° ° °DIET:  We do recommend a small meal at first, but then you may proceed to your regular diet.  Drink plenty of fluids but you should avoid alcoholic beverages for 24 hours. ° °ACTIVITY:  You should plan to take it easy for the rest of today and you should NOT DRIVE or use heavy machinery until tomorrow (because of  the sedation medicines used during the test).   ° °FOLLOW UP: °Our staff will call the number listed on your records 48-72 hours following your procedure to check on you and address any questions or concerns that you may have regarding the information given to you following your procedure. If we do not reach you, we will leave a message.  We will attempt to reach you two times.  During this call, we will ask if you have developed any symptoms of COVID 19. If you develop any symptoms (ie: fever, flu-like symptoms, shortness of breath, cough etc.) before then, please call [phone].  If you test positive for Covid 19 in the 2 weeks post procedure, please call and report this information to us.   ° °SIGNATURES/CONFIDENTIALITY: °You and/or your care partner have signed paperwork which will be entered into your electronic medical record.  These signatures attest to the fact that that the information above on your After Visit Summary has been reviewed and is understood.  Full responsibility of the confidentiality of this discharge information lies with you and/or your care-partner.  °

## 2021-10-17 NOTE — Progress Notes (Signed)
HPI: This is a man at routine risk for CRC   ROS: complete GI ROS as described in HPI, all other review negative.  Constitutional:  No unintentional weight loss   Past Medical History:  Diagnosis Date   Diabetes mellitus without complication ([place])    Essential hypertension, benign 01/03/2009   Qualifier: Diagnosis of  By: Sherren Mocha MD, Jory Ee    GERD (gastroesophageal reflux disease)    no longer on Pepcid     Hyperlipidemia    Hypertension     Past Surgical History:  Procedure Laterality Date   NO PAST SURGERIES      Current Outpatient Medications  Medication Sig Dispense Refill   atorvastatin (LIPITOR) 40 MG tablet Take 1 tablet (40 mg total) by mouth at bedtime. 90 tablet 1   fenofibrate 160 MG tablet Take 1 tablet (160 mg total) by mouth daily. 90 tablet 1   glucose blood test strip Use as directed once daily 100 each 0   metFORMIN (GLUCOPHAGE) 1000 MG tablet Take 1 tablet (1,000 mg total) by mouth 2 (two) times daily with a meal. 180 tablet 1   Multiple Vitamin (MULTIVITAMIN) tablet Take 1 tablet by mouth 2 (two) times daily.     omeprazole (PRILOSEC) 40 MG capsule Take 1 capsule (40 mg total) by mouth daily before breakfast. 90 capsule 3   Blood Glucose Monitoring Suppl (CONTOUR NEXT EZ) w/Device KIT Use as directed once daily 1 kit 0   Microlet Lancets MISC Use as directed once daily 100 each 0   Current Facility-Administered Medications  Medication Dose Route Frequency Provider Last Rate Last Admin   0.9 %  sodium chloride infusion  500 mL Intravenous Once Milus Banister, MD        Allergies as of 10/17/2021   (No Known Allergies)    Family History  Problem Relation Age of Onset   Hyperlipidemia Other    Hypertension Other    CAD Father 57   Dementia Mother    Colon cancer Neg Hx    Rectal cancer Neg Hx    Stomach cancer Neg Hx    Colon polyps Neg Hx    Esophageal cancer Neg Hx    Diabetes Neg Hx    Prostate cancer Neg Hx     Social History    Socioeconomic History   Marital status: Married    Spouse name: Not on file   Number of children: 1   Years of education: Not on file   Highest education level: Not on file  Occupational History   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 , software   Tobacco Use   Smoking status: Former    Types: Cigarettes    Quit date: 05/31/1999    Years since quitting: 22.3   Smokeless tobacco: Never  Substance and Sexual Activity   Alcohol use: Yes    Comment: occasional beer   Drug use: No   Sexual activity: Not on file  Other Topics Concern   Not on file  Social History Narrative   Wife is a Therapist, sports   Child in college   Social Determinants of Health   Financial Resource Strain: Not on file  Food Insecurity: Not on file  Transportation Needs: Not on file  Physical Activity: Not on file  Stress: Not on file  Social Connections: Not on file  Intimate Partner Violence: Not on file     Physical Exam: BP 130/77    Pulse 80    Temp (!) 97 F (36.1  C) (Temporal)    Ht 6' 8"  (2.032 m)    Wt 222 lb (100.7 kg)    SpO2 98%    BMI 24.39 kg/m  Constitutional: generally well-appearing Psychiatric: alert and oriented x3 Lungs: CTA bilaterally Heart: no MCR  Assessment and plan: 61 y.o. male with routine risk for CRC  Screenign colonoscopy today  Care is appropriate for the ambulatory setting.  Owens Loffler, MD [facility] 10/17/2021, 2:50 PM

## 2021-10-19 ENCOUNTER — Telehealth: Payer: Self-pay | Admitting: *Deleted

## 2021-10-19 NOTE — Telephone Encounter (Signed)
°  Follow up Call-  Call back number 10/17/2021  Post procedure Call Back phone  # [phone]  Permission to leave phone message Yes  Some recent data might be hidden     Patient questions:  Do you have a fever, pain , or abdominal swelling? No. Pain Score  0 *  Have you tolerated food without any problems? Yes.    Have you been able to return to your normal activities? Yes.    Do you have any questions about your discharge instructions: Diet   No. Medications  No. Follow up visit  No.  Do you have questions or concerns about your Care? No.  Actions: * If pain score is 4 or above: No action needed, pain <4.

## 2021-10-22 ENCOUNTER — Other Ambulatory Visit: Payer: Self-pay | Admitting: Internal Medicine

## 2021-10-23 ENCOUNTER — Other Ambulatory Visit (HOSPITAL_BASED_OUTPATIENT_CLINIC_OR_DEPARTMENT_OTHER): Payer: Self-pay

## 2021-10-23 MED ORDER — FENOFIBRATE 160 MG PO TABS
160.0000 mg | ORAL_TABLET | Freq: Every day | ORAL | 1 refills | Status: DC
  Filled 2021-10-23: qty 90, 90d supply, fill #0
  Filled 2022-01-10: qty 90, 90d supply, fill #1

## 2021-11-21 ENCOUNTER — Telehealth: Payer: 59 | Admitting: Family Medicine

## 2021-11-21 DIAGNOSIS — J029 Acute pharyngitis, unspecified: Secondary | ICD-10-CM

## 2021-11-22 ENCOUNTER — Telehealth: Payer: 59 | Admitting: Emergency Medicine

## 2021-11-22 DIAGNOSIS — J029 Acute pharyngitis, unspecified: Secondary | ICD-10-CM

## 2021-11-22 NOTE — Progress Notes (Signed)
?E-Visit for Sore Throat ? ?We are sorry that you are not feeling well.  Here is how we plan to help! ? ?Your symptoms indicate a likely viral infection (Pharyngitis).   Pharyngitis is inflammation in the back of the throat which can cause a sore throat, scratchiness and sometimes difficulty swallowing.   Pharyngitis is typically caused by a respiratory virus and will just run its course.  Please keep in mind that your symptoms could last up to 10 days.  For throat pain, we recommend over the counter oral pain relief medications such as acetaminophen or aspirin, or anti-inflammatory medications such as ibuprofen or naproxen sodium.  Topical treatments such as oral throat lozenges or sprays may be used as needed.  Avoid close contact with loved ones, especially the very young and elderly.  Remember to wash your hands thoroughly throughout the day as this is the number one way to prevent the spread of infection and wipe down door knobs and counters with disinfectant. ? ?I'd recommend taking an antihistamine such as Zyrtec or Claritin as well just in case you have some post-nasal drip exacerbating the sore throat. ? ?After careful review of your answers, I would not recommend an antibiotic for your condition.  Antibiotics should not be used to treat conditions that we suspect are caused by viruses like the virus that causes the common cold or flu. However, some people can have Strep with atypical symptoms. You may need formal testing in clinic or office to confirm if your symptoms continue or worsen. ? ?Providers prescribe antibiotics to treat infections caused by bacteria. Antibiotics are very powerful in treating bacterial infections when they are used properly.  To maintain their effectiveness, they should be used only when necessary.  Overuse of antibiotics has resulted in the development of super bugs that are resistant to treatment!   ? ?Home Care: ?Only take medications as instructed by your medical team. ?Do  not drink alcohol while taking these medications. ?A steam or ultrasonic humidifier can help congestion.  You can place a towel over your head and breathe in the steam from hot water coming from a faucet. ?Avoid close contacts especially the very young and the elderly. ?Cover your mouth when you cough or sneeze. ?Always remember to wash your hands. ? ?Get Help Right Away If: ?You develop worsening fever or throat pain. ?You develop a severe head ache or visual changes. ?Your symptoms persist after you have completed your treatment plan. ? ?Make sure you ?Understand these instructions. ?Will watch your condition. ?Will get help right away if you are not doing well or get worse. ? ? ?Thank you for choosing an e-visit. ? ?Your e-visit answers were reviewed by a board certified advanced clinical practitioner to complete your personal care plan. Depending upon the condition, your plan could have included both over the counter or prescription medications. ? ?Please review your pharmacy choice. Make sure the pharmacy is open so you can pick up prescription now. If there is a problem, you may contact your provider through [employer] and have the prescription routed to another pharmacy.  Your safety is important to [place]. If you have drug allergies check your prescription carefully.  ? ?For the next 24 hours you can use MyChart to ask questions about today's visit, request a non-urgent call back, or ask for a work or school excuse. ?You will get an email in the next two days asking about your experience. I hope that your e-visit has been valuable and  will speed your recovery. ? ?Approximately 5 minutes was used in reviewing the patient's chart, questionnaire, prescribing medications, and documentation. ? ?

## 2021-11-22 NOTE — Progress Notes (Signed)
[place]  ? ?More info needed, after an hour, note closed due to no response. Advised to resubmit. ?

## 2022-01-10 ENCOUNTER — Other Ambulatory Visit: Payer: Self-pay | Admitting: Internal Medicine

## 2022-01-10 ENCOUNTER — Other Ambulatory Visit (HOSPITAL_BASED_OUTPATIENT_CLINIC_OR_DEPARTMENT_OTHER): Payer: Self-pay

## 2022-01-10 MED ORDER — METFORMIN HCL 1000 MG PO TABS
1000.0000 mg | ORAL_TABLET | Freq: Two times a day (BID) | ORAL | 0 refills | Status: DC
  Filled 2022-01-10: qty 180, 90d supply, fill #0

## 2022-01-17 ENCOUNTER — Ambulatory Visit (INDEPENDENT_AMBULATORY_CARE_PROVIDER_SITE_OTHER): Payer: 59 | Admitting: Internal Medicine

## 2022-01-17 ENCOUNTER — Encounter: Payer: Self-pay | Admitting: Internal Medicine

## 2022-01-17 VITALS — BP 126/76 | HR 74 | Temp 97.7°F | Resp 18 | Ht >= 80 in | Wt 223.0 lb

## 2022-01-17 DIAGNOSIS — Z0000 Encounter for general adult medical examination without abnormal findings: Secondary | ICD-10-CM

## 2022-01-17 DIAGNOSIS — I10 Essential (primary) hypertension: Secondary | ICD-10-CM

## 2022-01-17 DIAGNOSIS — E781 Pure hyperglyceridemia: Secondary | ICD-10-CM | POA: Diagnosis not present

## 2022-01-17 DIAGNOSIS — E119 Type 2 diabetes mellitus without complications: Secondary | ICD-10-CM | POA: Diagnosis not present

## 2022-01-17 LAB — CBC WITH DIFFERENTIAL/PLATELET
Basophils Absolute: 0.1 10*3/uL (ref 0.0–0.1)
Basophils Relative: 1.2 % (ref 0.0–3.0)
Eosinophils Absolute: 0.2 10*3/uL (ref 0.0–0.7)
Eosinophils Relative: 2.7 % (ref 0.0–5.0)
HCT: 43.7 % (ref 39.0–52.0)
Hemoglobin: 14.5 g/dL (ref 13.0–17.0)
Lymphocytes Relative: 28.1 % (ref 12.0–46.0)
Lymphs Abs: 2.5 10*3/uL (ref 0.7–4.0)
MCHC: 33.2 g/dL (ref 30.0–36.0)
MCV: 93.1 fl (ref 78.0–100.0)
Monocytes Absolute: 0.6 10*3/uL (ref 0.1–1.0)
Monocytes Relative: 6.8 % (ref 3.0–12.0)
Neutro Abs: 5.4 10*3/uL (ref 1.4–7.7)
Neutrophils Relative %: 61.2 % (ref 43.0–77.0)
Platelets: 230 10*3/uL (ref 150.0–400.0)
RBC: 4.7 Mil/uL (ref 4.22–5.81)
RDW: 14.4 % (ref 11.5–15.5)
WBC: 8.7 10*3/uL (ref 4.0–10.5)

## 2022-01-17 LAB — COMPREHENSIVE METABOLIC PANEL
ALT: 34 U/L (ref 0–53)
AST: 22 U/L (ref 0–37)
Albumin: 4.7 g/dL (ref 3.5–5.2)
Alkaline Phosphatase: 33 U/L — ABNORMAL LOW (ref 39–117)
BUN: 8 mg/dL (ref 6–23)
CO2: 28 mEq/L (ref 19–32)
Calcium: 10 mg/dL (ref 8.4–10.5)
Chloride: 102 mEq/L (ref 96–112)
Creatinine, Ser: 0.89 mg/dL (ref 0.40–1.50)
GFR: 91.53 mL/min (ref >60.00)
Glucose, Bld: 125 mg/dL — ABNORMAL HIGH (ref 70–99)
Potassium: 4.4 mEq/L (ref 3.5–5.1)
Sodium: 138 mEq/L (ref 135–145)
Total Bilirubin: 0.5 mg/dL (ref 0.2–1.2)
Total Protein: 7.2 g/dL (ref 6.0–8.3)

## 2022-01-17 LAB — MICROALBUMIN / CREATININE URINE RATIO
Creatinine,U: 137.2 mg/dL
Microalb Creat Ratio: 0.7 mg/g (ref 0.0–30.0)
Microalb, Ur: 0.9 mg/dL (ref 0.0–1.9)

## 2022-01-17 LAB — LIPID PANEL
Cholesterol: 175 mg/dL (ref 0–200)
HDL: 37.9 mg/dL — ABNORMAL LOW (ref >39.00)
NonHDL: 136.87
Total CHOL/HDL Ratio: 5
Triglycerides: 221 mg/dL — ABNORMAL HIGH (ref 0.0–149.0)
VLDL: 44.2 mg/dL — ABNORMAL HIGH (ref 0.0–40.0)

## 2022-01-17 LAB — LDL CHOLESTEROL, DIRECT: Direct LDL: 107 mg/dL

## 2022-01-17 LAB — HEMOGLOBIN A1C: Hgb A1c MFr Bld: 6.3 % (ref 4.6–6.5)

## 2022-01-17 NOTE — Progress Notes (Signed)
Subjective:    Patient ID: Seth Pineda, male    DOB: 15-May-1961, 61 y.o.   MRN: [id number]  DOS:  01/17/2022 Type of visit - description: CPX  Here for CPX. He reports actually no concern  Review of Systems   A 14 point review of systems is negative    Past Medical History:  Diagnosis Date   Diabetes mellitus without complication (HCC)    Essential hypertension, benign 01/03/2009   Qualifier: Diagnosis of  By: Tawanna Cooler MD, Tinnie Gens A    GERD (gastroesophageal reflux disease)    no longer on Pepcid     Hyperlipidemia    Hypertension     Past Surgical History:  Procedure Laterality Date   NO PAST SURGERIES     Social History   Socioeconomic History   Marital status: Married    Spouse name: Not on file   Number of children: 1   Years of education: Not on file   Highest education level: Not on file  Occupational History   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 , software   Tobacco Use   Smoking status: Former    Types: Cigarettes    Quit date: 05/31/1999    Years since quitting: 22.6   Smokeless tobacco: Never  Substance and Sexual Activity   Alcohol use: Yes    Comment: occasional beer   Drug use: No   Sexual activity: Not on file  Other Topics Concern   Not on file  Social History Narrative   Wife is a Charity fundraiser   Child in college   Social Determinants of Health   Financial Resource Strain: Not on file  Food Insecurity: Not on file  Transportation Needs: Not on file  Physical Activity: Not on file  Stress: Not on file  Social Connections: Not on file  Intimate Partner Violence: Not on file    Current Outpatient Medications  Medication Instructions   aspirin EC 81 mg, Oral, Daily, Swallow whole.   atorvastatin (LIPITOR) 40 mg, Oral, Daily at bedtime   fenofibrate 160 mg, Oral, Daily   glucose blood test strip Use as directed once daily   metFORMIN (GLUCOPHAGE) 1,000 mg, Oral, 2 times daily with meals   Microlet Lancets MISC Use as directed once daily   Multiple  Vitamin (MULTIVITAMIN) tablet 1 tablet, Oral, 2 times daily   omeprazole (PRILOSEC) 40 mg, Oral, Daily before breakfast       Objective:   Physical Exam BP 126/76 (BP Location: Left Arm, Patient Position: Sitting, Cuff Size: Normal)   Pulse 74   Temp 97.7 F (36.5 C) (Oral)   Resp 18   Ht 6\' 8"  (2.032 m)   Wt 223 lb (101.2 kg)   SpO2 97%   BMI 24.50 kg/m  General: Well developed, NAD, BMI noted Neck: No  thyromegaly  HEENT:  Normocephalic . Face symmetric, atraumatic Lungs:  CTA B Normal respiratory effort, no intercostal retractions, no accessory muscle use. Heart: RRR,  no murmur.  Abdomen:  Not distended, soft, non-tender. No rebound or rigidity.   DM foot exam: No edema, good pedal pulses, pinprick examination normal Skin: Exposed areas without rash. Not pale. Not jaundice Neurologic:  alert & oriented X3.  Speech normal, gait appropriate for age and unassisted Strength symmetric and appropriate for age.  Psych: Cognition and judgment appear intact.  Cooperative with normal attention span and concentration.  Behavior appropriate. No anxious or depressed appearing.     Assessment      ASSESSMENT DM A1c 6.5 (  9-2 20) HTN dx in his 38 Dyslipidemia Back pain  GERD: EGD 10-2017, severe ulcerative esophagitis.  BX negative for H. pylori or dysplasia. E.D. h/o low testosterone   PLAN Here for CPX DM: On metformin, lifestyle is healthy, check A1c and micro.  Feet exam negative, feet care discussed. HTN: Ambulatory BPs okay, lifestyle controlled, checking labs High cholesterol: Last LDL slightly elevated, he continue fenofibrate, Lipitor was increased to 40 mg.  Checking labs Aspirin: Recommend to start aspirin 81 mg daily with GI precautions.  He has a history of ulcerative esophagitis, currently asymptomatic, watch for GI symptoms. RTC 6 months

## 2022-01-17 NOTE — Patient Instructions (Addendum)
Recommend to proceed with covid booster (bivalent) at your pharmacy.   Per our records you are due for your diabetic eye exam. Please contact your eye doctor to schedule an appointment. Please have them send copies of your office visit notes to [place]. Our fax number is [phone]. If you need a referral to an eye doctor please let us know.  To find a good quality blood pressure cuff:  [email or web address]   Check the  blood pressure regularly BP GOAL is between 110/65 and  135/85. If it is consistently higher or lower, let me know     GO TO THE LAB : Get the blood work     [place], [place] back for a checkup in 6 months    Diabetes Mellitus and [place] care is an important part of your health, especially when you have diabetes. Diabetes may cause you to have problems because of poor blood flow (circulation) to your feet and legs, which can cause your skin to: Become thinner and drier. Break more easily. Heal more slowly. Peel and crack. You may also have nerve damage (neuropathy) in your legs and feet, causing decreased feeling in them. This means that you may not notice minor injuries to your feet that could lead to more serious problems. Noticing and addressing any potential problems early is the best way to prevent future foot problems. How to care for your feet Foot hygiene  Wash your feet daily with warm water and mild soap. Do not use hot water. Then, pat your feet and the areas between your toes until they are completely dry. Do not soak your feet as this can dry your skin. Trim your toenails straight across. Do not dig under them or around the cuticle. File the edges of your nails with an emery board or nail file. Apply a moisturizing lotion or petroleum jelly to the skin on your feet and to dry, brittle toenails. Use lotion that does not contain alcohol and is unscented. Do not apply lotion between your toes. Shoes and  socks Wear clean socks or stockings every day. Make sure they are not too tight. Do not wear knee-high stockings since they may decrease blood flow to your legs. Wear shoes that fit properly and have enough cushioning. Always look in your shoes before you put them on to be sure there are no objects inside. To break in new shoes, wear them for just a few hours a day. This prevents injuries on your feet. Wounds, scrapes, corns, and calluses  Check your feet daily for blisters, cuts, bruises, sores, and redness. If you cannot see the bottom of your feet, use a mirror or ask someone for help. Do not cut corns or calluses or try to remove them with medicine. If you find a minor scrape, cut, or break in the skin on your feet, keep it and the skin around it clean and dry. You may clean these areas with mild soap and water. Do not clean the area with peroxide, alcohol, or iodine. If you have a wound, scrape, corn, or callus on your foot, look at it several times a day to make sure it is healing and not infected. Check for: Redness, swelling, or pain. Fluid or blood. Warmth. Pus or a bad smell. General tips Do not cross your legs. This may decrease blood flow to your feet. Do not use heating pads or hot water bottles on your feet. They may  burn your skin. If you have lost feeling in your feet or legs, you may not know this is happening until it is too late. Protect your feet from hot and cold by wearing shoes, such as at the beach or on hot pavement. Schedule a complete foot exam at least once a year (annually) or more often if you have foot problems. Report any cuts, sores, or bruises to your health care provider immediately. Where to find more information American Diabetes Association: www.diabetes.org Association of Diabetes Care & Education Specialists: www.diabeteseducator.org Contact a health care provider if: You have a medical condition that increases your risk of infection and you have any  cuts, sores, or bruises on your feet. You have an injury that is not healing. You have redness on your legs or feet. You feel burning or tingling in your legs or feet. You have pain or cramps in your legs and feet. Your legs or feet are numb. Your feet always feel cold. You have pain around any toenails. Get help right away if: You have a wound, scrape, corn, or callus on your foot and: You have pain, swelling, or redness that gets worse. You have fluid or blood coming from the wound, scrape, corn, or callus. Your wound, scrape, corn, or callus feels warm to the touch. You have pus or a bad smell coming from the wound, scrape, corn, or callus. You have a fever. You have a red line going up your leg. Summary Check your feet every day for blisters, cuts, bruises, sores, and redness. Apply a moisturizing lotion or petroleum jelly to the skin on your feet and to dry, brittle toenails. Wear shoes that fit properly and have enough cushioning. If you have foot problems, report any cuts, sores, or bruises to your health care provider immediately. Schedule a complete foot exam at least once a year (annually) or more often if you have foot problems. This information is not intended to replace advice given to you by your health care provider. Make sure you discuss any questions you have with your health care provider. Document Revised: 03/09/2020 Document Reviewed: 03/09/2020 Elsevier Patient Education  [employer].

## 2022-01-19 ENCOUNTER — Encounter: Payer: Self-pay | Admitting: Internal Medicine

## 2022-01-19 NOTE — Assessment & Plan Note (Signed)
Here for CPX DM: On metformin, lifestyle is healthy, check A1c and micro.  Feet exam negative, feet care discussed. HTN: Ambulatory BPs okay, lifestyle controlled, checking labs High cholesterol: Last LDL slightly elevated, he continue fenofibrate, Lipitor was increased to 40 mg.  Checking labs Aspirin: Recommend to start aspirin 81 mg daily with GI precautions.  He has a history of ulcerative esophagitis, currently asymptomatic, watch for GI symptoms. RTC 6 months

## 2022-01-19 NOTE — Assessment & Plan Note (Signed)
-   Td 2020 - PNM 23: 12/2020 - shingrex is a consideration - covid vax booster: is a consideration - CCS: cscope 2012, C-scope 10-2021, next per GI - prostate ca screen: DRE and PSA last year normal. - Diet and exercise : takes a walk twice daily, diet is okay - Labs: CMP, FLP, CBC, A1c, micro -ACP package of information provided

## 2022-04-12 ENCOUNTER — Other Ambulatory Visit: Payer: Self-pay | Admitting: Internal Medicine

## 2022-04-12 ENCOUNTER — Other Ambulatory Visit (HOSPITAL_BASED_OUTPATIENT_CLINIC_OR_DEPARTMENT_OTHER): Payer: Self-pay

## 2022-04-12 MED ORDER — METFORMIN HCL 1000 MG PO TABS
1000.0000 mg | ORAL_TABLET | Freq: Two times a day (BID) | ORAL | 0 refills | Status: DC
  Filled 2022-04-12: qty 180, 90d supply, fill #0

## 2022-04-12 MED ORDER — ATORVASTATIN CALCIUM 40 MG PO TABS
40.0000 mg | ORAL_TABLET | Freq: Every day | ORAL | 1 refills | Status: DC
Start: 2022-04-12 — End: 2022-10-29
  Filled 2022-04-12: qty 90, 90d supply, fill #0
  Filled 2022-07-30: qty 90, 90d supply, fill #1

## 2022-04-30 ENCOUNTER — Encounter: Payer: Self-pay | Admitting: Internal Medicine

## 2022-04-30 ENCOUNTER — Other Ambulatory Visit (HOSPITAL_BASED_OUTPATIENT_CLINIC_OR_DEPARTMENT_OTHER): Payer: Self-pay

## 2022-04-30 ENCOUNTER — Other Ambulatory Visit: Payer: Self-pay | Admitting: Internal Medicine

## 2022-04-30 MED ORDER — FENOFIBRATE 160 MG PO TABS
160.0000 mg | ORAL_TABLET | Freq: Every day | ORAL | 1 refills | Status: DC
  Filled 2022-04-30: qty 90, 90d supply, fill #0
  Filled 2022-08-29: qty 90, 90d supply, fill #1

## 2022-07-03 ENCOUNTER — Other Ambulatory Visit (HOSPITAL_BASED_OUTPATIENT_CLINIC_OR_DEPARTMENT_OTHER): Payer: Self-pay

## 2022-07-03 ENCOUNTER — Telehealth: Payer: 59 | Admitting: Physician Assistant

## 2022-07-03 DIAGNOSIS — U071 COVID-19: Secondary | ICD-10-CM

## 2022-07-03 MED ORDER — NIRMATRELVIR/RITONAVIR (PAXLOVID)TABLET
3.0000 | ORAL_TABLET | Freq: Two times a day (BID) | ORAL | 0 refills | Status: AC
  Filled 2022-07-03: qty 30, 5d supply, fill #0

## 2022-07-03 NOTE — Patient Instructions (Signed)
Seth Pineda, thank you for joining Margaretann Loveless, PA-C for today's virtual visit.  While this provider is not your primary care provider (PCP), if your PCP is located in our provider database this encounter information will be shared with them immediately following your visit.   A [facility] MyChart account gives you access to today's visit and all your visits, tests, and labs performed at [facility] " click here if you don't have a [facility] MyChart account or go to [email or web address]  Consent: (Patient) Seth Pineda provided verbal consent for this virtual visit at the beginning of the encounter.  Current Medications:  Current Outpatient Medications:    nirmatrelvir/ritonavir EUA (PAXLOVID) 20 x 150 MG & 10 x 100MG  TABS, Take 3 tablets by mouth 2 (two) times daily for 5 days. (Take nirmatrelvir 150 mg two tablets twice daily for 5 days and ritonavir 100 mg one tablet twice daily for 5 days) Patient GFR is 91.3, Disp: 30 tablet, Rfl: 0   aspirin EC 81 MG tablet, Take 81 mg by mouth daily. Swallow whole., Disp: , Rfl:    atorvastatin (LIPITOR) 40 MG tablet, Take 1 tablet (40 mg total) by mouth at bedtime., Disp: 90 tablet, Rfl: 1   fenofibrate 160 MG tablet, Take 1 tablet (160 mg total) by mouth daily., Disp: 90 tablet, Rfl: 1   glucose blood test strip, Use as directed once daily, Disp: 100 each, Rfl: 0   metFORMIN (GLUCOPHAGE) 1000 MG tablet, Take 1 tablet (1,000 mg total) by mouth 2 (two) times daily with a meal., Disp: 180 tablet, Rfl: 0   Microlet Lancets MISC, Use as directed once daily (Patient not taking: Reported on 01/17/2022), Disp: 100 each, Rfl: 0   Multiple Vitamin (MULTIVITAMIN) tablet, Take 1 tablet by mouth 2 (two) times daily., Disp: , Rfl:    omeprazole (PRILOSEC) 40 MG capsule, Take 1 capsule (40 mg total) by mouth daily before breakfast., Disp: 90 capsule, Rfl: 3   Medications ordered in this encounter:  Meds ordered this encounter   Medications   nirmatrelvir/ritonavir EUA (PAXLOVID) 20 x 150 MG & 10 x 100MG  TABS    Sig: Take 3 tablets by mouth 2 (two) times daily for 5 days. (Take nirmatrelvir 150 mg two tablets twice daily for 5 days and ritonavir 100 mg one tablet twice daily for 5 days) Patient GFR is 91.3    Dispense:  30 tablet    Refill:  0    Order Specific Question:   Supervising Provider    Answer:   01/19/2022     *If you need refills on other medications prior to your next appointment, please contact your pharmacy*  Follow-Up: Call back or seek an in-person evaluation if the symptoms worsen or if the condition fails to improve as anticipated.  [facility] [phone]  Other Instructions  Can take to lessen severity: Vit C 500mg  twice daily Quercertin 250-500mg  twice daily Zinc 75-100mg  daily Melatonin 3-6 mg at bedtime Vit D3 1000-2000 IU daily Aspirin 81 mg daily with food Optional: Famotidine 20mg  daily Also can add tylenol/ibuprofen as needed for fevers and body aches May add Mucinex or Mucinex DM as needed for cough/congestion'  COVID-19 COVID-19, or coronavirus disease 2019, is an infection that is caused by a new (novel) coronavirus called SARS-CoV-2. COVID-19 can cause many symptoms. In some people, the virus may not cause any symptoms. In others, it may cause mild or severe symptoms. Some people with  severe infection develop severe disease. What are the causes? This illness is caused by a virus. The virus may be in the air as tiny specks of fluid (aerosols) or droplets, or it may be on surfaces. You may catch the virus by: Breathing in droplets from an infected person. Droplets can be spread by a person breathing, speaking, singing, coughing, or sneezing. Touching something, like a table or a doorknob, that has virus on it (is contaminated) and then touching your mouth, nose, or eyes. What increases the risk? Risk for infection: You are more likely to  get infected with the COVID-19 virus if: You are within 6 ft (1.8 m) of a person with COVID-19 for 15 minutes or longer. You are providing care for a person who is infected with COVID-19. You are in close personal contact with other people. Close personal contact includes hugging, kissing, or sharing eating or drinking utensils. Risk for serious illness caused by COVID-19: You are more likely to get seriously ill from the COVID-19 virus if: You have cancer. You have a long-term (chronic) disease, such as: Chronic lung disease. This includes pulmonary embolism, chronic obstructive pulmonary disease, and cystic fibrosis. Long-term disease that lowers your body's ability to fight infection (immunocompromise). Serious cardiac conditions, such as heart failure, coronary artery disease, or cardiomyopathy. Diabetes. Chronic kidney disease. Liver diseases. These include cirrhosis, nonalcoholic fatty liver disease, alcoholic liver disease, or autoimmune hepatitis. You have obesity. You are pregnant or were recently pregnant. You have sickle cell disease. What are the signs or symptoms? Symptoms of this condition can range from mild to severe. Symptoms may appear any time from 2 to 14 days after being exposed to the virus. They include: Fever or chills. Shortness of breath or trouble breathing. Feeling tired or very tired. Headaches, body aches, or muscle aches. Runny or stuffy nose, sneezing, coughing, or sore throat. New loss of taste or smell. This is rare. Some people may also have stomach problems, such as nausea, vomiting, or diarrhea. Other people may not have any symptoms of COVID-19. How is this diagnosed? This condition may be diagnosed by testing samples to check for the COVID-19 virus. The most common tests are the PCR test and the antigen test. Tests may be done in the lab or at home. They include: Using a swab to take a sample of fluid from the back of your nose and throat  (nasopharyngeal fluid), from your nose, or from your throat. Testing a sample of saliva from your mouth. Testing a sample of coughed-up mucus from your lungs (sputum). How is this treated? Treatment for COVID-19 infection depends on the severity of the condition. Mild symptoms can be managed at home with rest, fluids, and over-the-counter medicines. Serious symptoms may be treated in a hospital intensive care unit (ICU). Treatment in the ICU may include: Supplemental oxygen. Extra oxygen is given through a tube in the nose, a face mask, or a hood. Medicines. These may include: Antivirals, such as monoclonal antibodies. These help your body fight off certain viruses that can cause disease. Anti-inflammatories, such as corticosteroids. These reduce inflammation and suppress the immune system. Antithrombotics. These prevent or treat blood clots, if they develop. Convalescent plasma. This helps boost your immune system, if you have an underlying immunosuppressive condition or are getting immunosuppressive treatments. Prone positioning. This means you will lie on your stomach. This helps oxygen to get into your lungs. Infection control measures. If you are at risk for more serious illness caused by COVID-19, your  health care provider may prescribe two long-acting monoclonal antibodies, given together every 6 months. How is this prevented? To protect yourself: Use preventive medicine (pre-exposure prophylaxis). You may get pre-exposure prophylaxis if you have moderate or severe immunocompromise. Get vaccinated. Anyone 68 months old or older who meets guidelines can get a COVID-19 vaccine or vaccine series. This includes people who are pregnant or making breast milk (lactating). Get an added dose of COVID-19 vaccine after your first vaccine or vaccine series if you have moderate to severe immunocompromise. This applies if you have had a solid organ transplant or have been diagnosed with an  immunocompromising condition. You should get the added dose 4 weeks after you got the first COVID-19 vaccine or vaccine series. If you get an mRNA vaccine, you will need a 3-dose primary series. If you get the J&J/Janssen vaccine, you will need a 2-dose primary series, with the second dose being an mRNA vaccine. Talk to your health care provider about getting experimental monoclonal antibodies. This treatment is approved under emergency use authorization to prevent severe illness before or after being exposed to the COVID-19 virus. You may be given monoclonal antibodies if: You have moderate or severe immunocompromise. This includes treatments that lower your immune response. People with immunocompromise may not develop protection against COVID-19 when they are vaccinated. You cannot be vaccinated. You may not get a vaccine if you have a severe allergic reaction to the vaccine or its components. You are not fully vaccinated. You are in a facility where COVID-19 is present and: Are in close contact with a person who is infected with the COVID-19 virus. Are at high risk of being exposed to the COVID-19 virus. You are at risk of illness from new variants of the COVID-19 virus. To protect others: If you have symptoms of COVID-19, take steps to prevent the virus from spreading to others. Stay home. Leave your house only to get medical care. Do not use public transit, if possible. Do not travel while you are sick. Wash your hands often with soap and water for at least 20 seconds. If soap and water are not available, use alcohol-based hand sanitizer. Make sure that all people in your household wash their hands well and often. Cough or sneeze into a tissue or your sleeve or elbow. Do not cough or sneeze into your hand or into the air. Where to find more information Centers for Disease Control and Prevention: https://www.clark-whitaker.org/ World Health Organization: https://thompson-craig.com/ Get  help right away if: You have trouble breathing. You have pain or pressure in your chest. You are confused. You have bluish lips and fingernails. You have trouble waking from sleep. You have symptoms that get worse. These symptoms may be an emergency. Get help right away. Call 911. Do not wait to see if the symptoms will go away. Do not drive yourself to the hospital. Summary COVID-19 is an infection that is caused by a new coronavirus. Sometimes, there are no symptoms. Other times, symptoms range from mild to severe. Some people with a severe COVID-19 infection develop severe disease. The virus that causes COVID-19 can spread from person to person through droplets or aerosols from breathing, speaking, singing, coughing, or sneezing. Mild symptoms of COVID-19 can be managed at home with rest, fluids, and over-the-counter medicines. This information is not intended to replace advice given to you by your health care provider. Make sure you discuss any questions you have with your health care provider. Document Revised: 08/07/2021 Document Reviewed: 08/09/2021 Elsevier Patient  Education  [place].    If you have been instructed to have an in-person evaluation today at a local Urgent Care facility, please use the link below. It will take you to a list of all of our available [facility], including address, phone number and hours of operation. Please do not delay care.  [facility]  If you or a family member do not have a primary care provider, use the link below to schedule a visit and establish care. When you choose a [facility] primary care physician or advanced practice provider, you gain a long-term partner in health. Find a Primary Care Provider  Learn more about 's in-office and virtual care options: [facility]

## 2022-07-03 NOTE — Progress Notes (Signed)
Virtual Visit Consent   [name], you are scheduled for a virtual visit with a [facility] provider today. Just as with appointments in the office, your consent must be obtained to participate. Your consent will be active for this visit and any virtual visit you may have with one of our providers in the next 365 days. If you have a MyChart account, a copy of this consent can be sent to you electronically.  As this is a virtual visit, video technology does not allow for your provider to perform a traditional examination. This may limit your provider's ability to fully assess your condition. If your provider identifies any concerns that need to be evaluated in person or the need to arrange testing (such as labs, EKG, etc.), we will make arrangements to do so. Although advances in technology are sophisticated, we cannot ensure that it will always work on either your end or our end. If the connection with a video visit is poor, the visit may have to be switched to a telephone visit. With either a video or telephone visit, we are not always able to ensure that we have a secure connection.  By engaging in this virtual visit, you consent to the provision of healthcare and authorize for your insurance to be billed (if applicable) for the services provided during this visit. Depending on your insurance coverage, you may receive a charge related to this service.  I need to obtain your verbal consent now. Are you willing to proceed with your visit today? Seth Pineda has provided verbal consent on 07/03/2022 for a virtual visit (video or telephone). Mar Daring, PA-C  Date: 07/03/2022 10:48 AM  Virtual Visit via Video Note   I, Mar Daring, connected with  Seth Pineda  ([id number], 1960-11-09) on 07/03/22 at 10:45 AM EDT by a video-enabled telemedicine application and verified that I am speaking with the correct person using two identifiers.  Location: Patient: Virtual Visit  Location Patient: Home Provider: Virtual Visit Location Provider: Home Office   I discussed the limitations of evaluation and management by telemedicine and the availability of in person appointments. The patient expressed understanding and agreed to proceed.    History of Present Illness: Seth Pineda is a 61 y.o. who identifies as a male who was assigned male at birth, and is being seen today for Covid 52.  HPI: URI  This is a new problem. Episode onset: Tested positive on at home Covid 19 test today; Symptoms started late Monday. The problem has been gradually worsening. Associated symptoms include congestion, coughing, rhinorrhea and a sore throat. Pertinent negatives include no diarrhea, ear pain, headaches, nausea, plugged ear sensation, sinus pain or vomiting. Associated symptoms comments: Chills, myalgias, fatigue. Treatments tried: cough drops, sore throat lozenges, naprosyn, amoxicillin initially becuase thought it was strep; took 2 days. The treatment provided no relief.     Problems:  Patient Active Problem List   Diagnosis Date Noted   Diabetes mellitus without complication ([facility]) 67/34/1939   Erectile dysfunction 06/01/2019   Dysphagia 09/04/2017   Gastroesophageal reflux disease 09/04/2017   PCP NOTES >>>>>>>>>>>>>>>> 06/04/2017   Annual physical exam 10/10/2015   Low back pain with left-sided sciatica 05/30/2015   Abdominal pain, epigastric 01/31/2014   Low back pain with right-sided sciatica 01/31/2014   Essential hypertension, benign 01/03/2009   HYPERTRIGLYCERIDEMIA 04/06/2007    Allergies: No Known Allergies Medications:  Current Outpatient Medications:    nirmatrelvir/ritonavir EUA (PAXLOVID) 20 x 150 MG &  10 x 100MG  TABS, Take 3 tablets by mouth 2 (two) times daily for 5 days. (Take nirmatrelvir 150 mg two tablets twice daily for 5 days and ritonavir 100 mg one tablet twice daily for 5 days) Patient GFR is 91.3, Disp: 30 tablet, Rfl: 0   aspirin EC 81 MG  tablet, Take 81 mg by mouth daily. Swallow whole., Disp: , Rfl:    atorvastatin (LIPITOR) 40 MG tablet, Take 1 tablet (40 mg total) by mouth at bedtime., Disp: 90 tablet, Rfl: 1   fenofibrate 160 MG tablet, Take 1 tablet (160 mg total) by mouth daily., Disp: 90 tablet, Rfl: 1   glucose blood test strip, Use as directed once daily, Disp: 100 each, Rfl: 0   metFORMIN (GLUCOPHAGE) 1000 MG tablet, Take 1 tablet (1,000 mg total) by mouth 2 (two) times daily with a meal., Disp: 180 tablet, Rfl: 0   Microlet Lancets MISC, Use as directed once daily (Patient not taking: Reported on 01/17/2022), Disp: 100 each, Rfl: 0   Multiple Vitamin (MULTIVITAMIN) tablet, Take 1 tablet by mouth 2 (two) times daily., Disp: , Rfl:    omeprazole (PRILOSEC) 40 MG capsule, Take 1 capsule (40 mg total) by mouth daily before breakfast., Disp: 90 capsule, Rfl: 3  Observations/Objective: Patient is well-developed, well-nourished in no acute distress.  Resting comfortably at home.  Head is normocephalic, atraumatic.  No labored breathing.  Speech is clear and coherent with logical content.  Patient is alert and oriented at baseline.    Assessment and Plan: 1. COVID-19 - MyChart COVID-19 home monitoring program; Future - nirmatrelvir/ritonavir EUA (PAXLOVID) 20 x 150 MG & 10 x 100MG  TABS; Take 3 tablets by mouth 2 (two) times daily for 5 days. (Take nirmatrelvir 150 mg two tablets twice daily for 5 days and ritonavir 100 mg one tablet twice daily for 5 days) Patient GFR is 91.3  Dispense: 30 tablet; Refill: 0  - Continue OTC symptomatic management of choice - Will send OTC vitamins and supplement information through AVS - Paxlovid prescribed; HOLD atorvastatin and fenofibrate - Patient enrolled in MyChart symptom monitoring - Push fluids - Rest as needed - Discussed return precautions and when to seek in-person evaluation, sent via AVS as well   Follow Up Instructions: I discussed the assessment and treatment plan  with the patient. The patient was provided an opportunity to ask questions and all were answered. The patient agreed with the plan and demonstrated an understanding of the instructions.  A copy of instructions were sent to the patient via MyChart unless otherwise noted below.    The patient was advised to call back or seek an in-person evaluation if the symptoms worsen or if the condition fails to improve as anticipated.  Time:  I spent 12 minutes with the patient via telehealth technology discussing the above problems/concerns.    Mar Daring, PA-C

## 2022-07-04 ENCOUNTER — Encounter: Payer: Self-pay | Admitting: Physician Assistant

## 2022-07-21 ENCOUNTER — Other Ambulatory Visit: Payer: Self-pay | Admitting: Internal Medicine

## 2022-07-22 ENCOUNTER — Other Ambulatory Visit (HOSPITAL_BASED_OUTPATIENT_CLINIC_OR_DEPARTMENT_OTHER): Payer: Self-pay

## 2022-07-22 MED ORDER — METFORMIN HCL 1000 MG PO TABS
1000.0000 mg | ORAL_TABLET | Freq: Two times a day (BID) | ORAL | 0 refills | Status: DC
  Filled 2022-07-22: qty 180, 90d supply, fill #0

## 2022-07-31 ENCOUNTER — Other Ambulatory Visit (HOSPITAL_BASED_OUTPATIENT_CLINIC_OR_DEPARTMENT_OTHER): Payer: Self-pay

## 2022-07-31 ENCOUNTER — Encounter: Payer: Self-pay | Admitting: Internal Medicine

## 2022-07-31 ENCOUNTER — Ambulatory Visit: Payer: 59 | Admitting: Internal Medicine

## 2022-07-31 VITALS — BP 132/80 | HR 67 | Temp 97.9°F | Resp 16 | Ht >= 80 in | Wt 227.0 lb

## 2022-07-31 DIAGNOSIS — Z09 Encounter for follow-up examination after completed treatment for conditions other than malignant neoplasm: Secondary | ICD-10-CM | POA: Diagnosis not present

## 2022-07-31 DIAGNOSIS — E119 Type 2 diabetes mellitus without complications: Secondary | ICD-10-CM

## 2022-07-31 DIAGNOSIS — I10 Essential (primary) hypertension: Secondary | ICD-10-CM

## 2022-07-31 LAB — BASIC METABOLIC PANEL
BUN: 12 mg/dL (ref 6–23)
CO2: 26 mEq/L (ref 19–32)
Calcium: 9.3 mg/dL (ref 8.4–10.5)
Chloride: 106 mEq/L (ref 96–112)
Creatinine, Ser: 0.77 mg/dL (ref 0.40–1.50)
GFR: 95.26 mL/min (ref >60.00)
Glucose, Bld: 128 mg/dL — ABNORMAL HIGH (ref 70–99)
Potassium: 4.2 mEq/L (ref 3.5–5.1)
Sodium: 140 mEq/L (ref 135–145)

## 2022-07-31 LAB — HEMOGLOBIN A1C: Hgb A1c MFr Bld: 7.4 % — ABNORMAL HIGH (ref 4.6–6.5)

## 2022-07-31 MED ORDER — PIOGLITAZONE HCL 30 MG PO TABS
30.0000 mg | ORAL_TABLET | Freq: Every day | ORAL | 4 refills | Status: DC
  Filled 2022-07-31: qty 30, 30d supply, fill #0
  Filled 2022-08-29: qty 30, 30d supply, fill #1
  Filled 2022-09-29: qty 30, 30d supply, fill #2
  Filled 2022-10-29: qty 30, 30d supply, fill #3

## 2022-07-31 NOTE — Progress Notes (Signed)
   Subjective:    Patient ID: Seth Pineda, male    DOB: 10-29-1960, 61 y.o.   MRN: [id number]  DOS:  07/31/2022 Type of visit - description: Follow-up  Routine checkup, has no concerns. Diagnosed with COVID few weeks ago, s/p  Paxlovid, feels fully recuperated.  Symptoms are mild. Taking aspirin without any GI side effects.  Review of Systems See above   Past Medical History:  Diagnosis Date   Diabetes mellitus without complication (HCC)    Essential hypertension, benign 01/03/2009   Qualifier: Diagnosis of  By: Tawanna Cooler MD, Eugenio Hoes    GERD (gastroesophageal reflux disease)    no longer on Pepcid     Hyperlipidemia    Hypertension     Past Surgical History:  Procedure Laterality Date   NO PAST SURGERIES      Current Outpatient Medications  Medication Instructions   aspirin EC 81 mg, Oral, Daily, Swallow whole.   atorvastatin (LIPITOR) 40 mg, Oral, Daily at bedtime   fenofibrate 160 mg, Oral, Daily   glucose blood test strip Use as directed once daily   metFORMIN (GLUCOPHAGE) 1,000 mg, Oral, 2 times daily with meals   Microlet Lancets MISC Use as directed once daily   Multiple Vitamin (MULTIVITAMIN) tablet 1 tablet, Oral, 2 times daily   omeprazole (PRILOSEC) 40 mg, Oral, Daily before breakfast       Objective:   Physical Exam BP 132/80   Pulse 67   Temp 97.9 F (36.6 C) (Oral)   Resp 16   Ht 6\' 8"  (2.032 m)   Wt 227 lb (103 kg)   SpO2 94%   BMI 24.94 kg/m  General:   Well developed, NAD, BMI noted. HEENT:  Normocephalic . Face symmetric, atraumatic Lungs:  CTA B Normal respiratory effort, no intercostal retractions, no accessory muscle use. Heart: RRR,  no murmur.  Lower extremities: no pretibial edema bilaterally  Skin: Not pale. Not jaundice Neurologic:  alert & oriented X3.  Speech normal, gait appropriate for age and unassisted Psych--  Cognition and judgment appear intact.  Cooperative with normal attention span and concentration.   Behavior appropriate. No anxious or depressed appearing.      Assessment     ASSESSMENT DM A1c 6.5 (9-2 20) HTN dx in his 38 Dyslipidemia Back pain  GERD: EGD 10-2017, severe ulcerative esophagitis.  BX negative for H. pylori or dysplasia. E.D. h/o low testosterone   PLAN DM: On metformin, last A1c satisfactory, recheck A1c HTN: Lifestyle controlled, check BMP. Dyslipidemia: Last LDL very close to 100, recommend to continue atorvastatin, fenofibrate, see if he can improve his already healthy lifestyle. On Aspirin: Tolerates well Preventive care: Had a flu shot, okay to proceed with the COVID-vaccine, he is somewhat hesitant. RTC 6 months CPX

## 2022-07-31 NOTE — Patient Instructions (Addendum)
Vaccines I recommend:  Shingrix (shingles) Covid booster RSV vaccine  Check the  blood pressure regularly BP GOAL is between 110/65 and  135/85. If it is consistently higher or lower, let me know      GO TO THE LAB : Get the blood work     GO TO THE FRONT DESK, PLEASE SCHEDULE YOUR APPOINTMENTS Come back for   physical exam by 01/2023   Per our records you are due for your diabetic eye exam. Please contact your eye doctor to schedule an appointment. Please have them send copies of your office visit notes to [place]. Our fax number is [phone]. If you need a referral to an eye doctor please let us know.

## 2022-07-31 NOTE — Assessment & Plan Note (Signed)
DM: On metformin, last A1c satisfactory, recheck A1c HTN: Lifestyle controlled, check BMP. Dyslipidemia: Last LDL very close to 100, recommend to continue atorvastatin, fenofibrate, see if he can improve his already healthy lifestyle. On Aspirin: Tolerates well Preventive care: Had a flu shot, okay to proceed with the COVID-vaccine, he is somewhat hesitant. RTC 6 months CPX

## 2022-07-31 NOTE — Addendum Note (Signed)
Addended byConrad Walton D on: 07/31/2022 04:26 PM   Modules accepted: Orders

## 2022-09-04 DIAGNOSIS — H524 Presbyopia: Secondary | ICD-10-CM | POA: Diagnosis not present

## 2022-09-04 DIAGNOSIS — E119 Type 2 diabetes mellitus without complications: Secondary | ICD-10-CM | POA: Diagnosis not present

## 2022-09-04 DIAGNOSIS — H35371 Puckering of macula, right eye: Secondary | ICD-10-CM | POA: Diagnosis not present

## 2022-09-04 DIAGNOSIS — H2513 Age-related nuclear cataract, bilateral: Secondary | ICD-10-CM | POA: Diagnosis not present

## 2022-09-04 DIAGNOSIS — H35362 Drusen (degenerative) of macula, left eye: Secondary | ICD-10-CM | POA: Diagnosis not present

## 2022-09-04 DIAGNOSIS — H5203 Hypermetropia, bilateral: Secondary | ICD-10-CM | POA: Diagnosis not present

## 2022-09-04 LAB — HM DIABETES EYE EXAM

## 2022-10-29 ENCOUNTER — Other Ambulatory Visit: Payer: Self-pay | Admitting: Internal Medicine

## 2022-10-29 ENCOUNTER — Other Ambulatory Visit (HOSPITAL_BASED_OUTPATIENT_CLINIC_OR_DEPARTMENT_OTHER): Payer: Self-pay

## 2022-10-29 ENCOUNTER — Encounter: Payer: Self-pay | Admitting: Internal Medicine

## 2022-10-29 MED ORDER — METFORMIN HCL 1000 MG PO TABS
1000.0000 mg | ORAL_TABLET | Freq: Two times a day (BID) | ORAL | 1 refills | Status: DC
  Filled 2022-10-29: qty 180, 90d supply, fill #0
  Filled 2023-01-30: qty 180, 90d supply, fill #1

## 2022-10-29 MED ORDER — ATORVASTATIN CALCIUM 40 MG PO TABS
40.0000 mg | ORAL_TABLET | Freq: Every day | ORAL | 1 refills | Status: DC
  Filled 2022-10-29: qty 90, 90d supply, fill #0
  Filled 2023-01-30: qty 90, 90d supply, fill #1

## 2022-11-12 ENCOUNTER — Other Ambulatory Visit (INDEPENDENT_AMBULATORY_CARE_PROVIDER_SITE_OTHER): Payer: Commercial Managed Care - PPO

## 2022-11-12 DIAGNOSIS — E119 Type 2 diabetes mellitus without complications: Secondary | ICD-10-CM | POA: Diagnosis not present

## 2022-11-12 LAB — HEMOGLOBIN A1C: Hgb A1c MFr Bld: 6.6 % — ABNORMAL HIGH (ref 4.6–6.5)

## 2022-11-15 ENCOUNTER — Other Ambulatory Visit (HOSPITAL_BASED_OUTPATIENT_CLINIC_OR_DEPARTMENT_OTHER): Payer: Self-pay

## 2022-11-15 MED ORDER — PIOGLITAZONE HCL 30 MG PO TABS
30.0000 mg | ORAL_TABLET | Freq: Every day | ORAL | 3 refills | Status: DC
  Filled 2022-11-15 – 2022-11-29 (×2): qty 90, 90d supply, fill #0
  Filled 2023-01-30 – 2023-03-12 (×2): qty 90, 90d supply, fill #1
  Filled 2023-05-11 – 2023-06-11 (×3): qty 90, 90d supply, fill #2
  Filled 2023-09-16: qty 90, 90d supply, fill #3

## 2022-11-15 NOTE — Addendum Note (Signed)
Addended byDamita Dunnings D on: 11/15/2022 07:55 AM   Modules accepted: Orders

## 2022-11-29 ENCOUNTER — Other Ambulatory Visit (HOSPITAL_BASED_OUTPATIENT_CLINIC_OR_DEPARTMENT_OTHER): Payer: Self-pay

## 2022-11-29 ENCOUNTER — Other Ambulatory Visit: Payer: Self-pay | Admitting: Internal Medicine

## 2022-11-29 ENCOUNTER — Other Ambulatory Visit: Payer: Self-pay

## 2022-12-02 ENCOUNTER — Other Ambulatory Visit (HOSPITAL_BASED_OUTPATIENT_CLINIC_OR_DEPARTMENT_OTHER): Payer: Self-pay

## 2022-12-02 MED ORDER — FENOFIBRATE 160 MG PO TABS
160.0000 mg | ORAL_TABLET | Freq: Every day | ORAL | 1 refills | Status: DC
  Filled 2022-12-02: qty 90, 90d supply, fill #0
  Filled 2023-01-30 – 2023-03-12 (×2): qty 90, 90d supply, fill #1

## 2023-01-29 ENCOUNTER — Ambulatory Visit (INDEPENDENT_AMBULATORY_CARE_PROVIDER_SITE_OTHER): Payer: Commercial Managed Care - PPO | Admitting: Internal Medicine

## 2023-01-29 ENCOUNTER — Encounter: Payer: Self-pay | Admitting: Internal Medicine

## 2023-01-29 VITALS — BP 130/84 | HR 77 | Temp 97.6°F | Resp 16 | Ht >= 80 in | Wt 229.0 lb

## 2023-01-29 DIAGNOSIS — E119 Type 2 diabetes mellitus without complications: Secondary | ICD-10-CM | POA: Diagnosis not present

## 2023-01-29 DIAGNOSIS — Z0000 Encounter for general adult medical examination without abnormal findings: Secondary | ICD-10-CM | POA: Diagnosis not present

## 2023-01-29 DIAGNOSIS — I10 Essential (primary) hypertension: Secondary | ICD-10-CM | POA: Diagnosis not present

## 2023-01-29 DIAGNOSIS — Z7984 Long term (current) use of oral hypoglycemic drugs: Secondary | ICD-10-CM | POA: Diagnosis not present

## 2023-01-29 DIAGNOSIS — E781 Pure hyperglyceridemia: Secondary | ICD-10-CM

## 2023-01-29 LAB — LIPID PANEL
Cholesterol: 181 mg/dL (ref 0–200)
HDL: 35.7 mg/dL — ABNORMAL LOW (ref >39.00)
NonHDL: 145.64
Total CHOL/HDL Ratio: 5
Triglycerides: 203 mg/dL — ABNORMAL HIGH (ref 0.0–149.0)
VLDL: 40.6 mg/dL — ABNORMAL HIGH (ref 0.0–40.0)

## 2023-01-29 LAB — BASIC METABOLIC PANEL
BUN: 14 mg/dL (ref 6–23)
CO2: 26 mEq/L (ref 19–32)
Calcium: 9.5 mg/dL (ref 8.4–10.5)
Chloride: 106 mEq/L (ref 96–112)
Creatinine, Ser: 0.91 mg/dL (ref 0.40–1.50)
GFR: 89.37 mL/min (ref >60.00)
Glucose, Bld: 104 mg/dL — ABNORMAL HIGH (ref 70–99)
Potassium: 4.3 mEq/L (ref 3.5–5.1)
Sodium: 140 mEq/L (ref 135–145)

## 2023-01-29 LAB — MICROALBUMIN / CREATININE URINE RATIO
Creatinine,U: 155.7 mg/dL
Microalb Creat Ratio: 0.4 mg/g (ref 0.0–30.0)
Microalb, Ur: <0.7 mg/dL (ref 0.0–1.9)

## 2023-01-29 LAB — CBC WITH DIFFERENTIAL/PLATELET
Basophils Absolute: 0.1 10*3/uL (ref 0.0–0.1)
Basophils Relative: 0.9 % (ref 0.0–3.0)
Eosinophils Absolute: 0.2 10*3/uL (ref 0.0–0.7)
Eosinophils Relative: 2.5 % (ref 0.0–5.0)
HCT: 43.3 % (ref 39.0–52.0)
Hemoglobin: 14.1 g/dL (ref 13.0–17.0)
Lymphocytes Relative: 28.3 % (ref 12.0–46.0)
Lymphs Abs: 2.4 10*3/uL (ref 0.7–4.0)
MCHC: 32.6 g/dL (ref 30.0–36.0)
MCV: 92.9 fl (ref 78.0–100.0)
Monocytes Absolute: 0.6 10*3/uL (ref 0.1–1.0)
Monocytes Relative: 7.6 % (ref 3.0–12.0)
Neutro Abs: 5.2 10*3/uL (ref 1.4–7.7)
Neutrophils Relative %: 60.7 % (ref 43.0–77.0)
Platelets: 215 10*3/uL (ref 150.0–400.0)
RBC: 4.66 Mil/uL (ref 4.22–5.81)
RDW: 14.8 % (ref 11.5–15.5)
WBC: 8.5 10*3/uL (ref 4.0–10.5)

## 2023-01-29 LAB — ALT: ALT: 29 U/L (ref 0–53)

## 2023-01-29 LAB — PSA: PSA: 0.23 ng/mL (ref 0.10–4.00)

## 2023-01-29 LAB — LDL CHOLESTEROL, DIRECT: Direct LDL: 118 mg/dL

## 2023-01-29 LAB — AST: AST: 27 U/L (ref 0–37)

## 2023-01-29 LAB — HEMOGLOBIN A1C: Hgb A1c MFr Bld: 6.2 % (ref 4.6–6.5)

## 2023-01-29 NOTE — Progress Notes (Signed)
Subjective:    Patient ID: Seth Pineda, male    DOB: February 04, 1961, 62 y.o.   MRN: [id number]  DOS:  01/29/2023 Type of visit - description: CPX  Since LOV is doing well. Has no major concerns.   Review of Systems   A 14 point review of systems is negative    Past Medical History:  Diagnosis Date   Diabetes mellitus without complication (HCC)    Essential hypertension, benign 01/03/2009   Qualifier: Diagnosis of  By: Tawanna Cooler MD, Tinnie Gens A    GERD (gastroesophageal reflux disease)    no longer on Pepcid     Hyperlipidemia    Hypertension     Past Surgical History:  Procedure Laterality Date   NO PAST SURGERIES     Social History   Socioeconomic History   Marital status: Married    Spouse name: Not on file   Number of children: 1   Years of education: Not on file   Highest education level: Not on file  Occupational History   Occupation: school bus driver  Tobacco Use   Smoking status: Former    Types: Cigarettes    Quit date: 05/31/1999    Years since quitting: 23.6   Smokeless tobacco: Never  Substance and Sexual Activity   Alcohol use: Yes    Comment: occasional beer   Drug use: No   Sexual activity: Not on file  Other Topics Concern   Not on file  Social History Narrative   Wife is a Charity fundraiser   Child in college   Social Determinants of Health   Financial Resource Strain: Not on file  Food Insecurity: Not on file  Transportation Needs: Not on file  Physical Activity: Not on file  Stress: Not on file  Social Connections: Not on file  Intimate Partner Violence: Not on file     Current Outpatient Medications  Medication Instructions   aspirin EC 81 mg, Oral, Daily, Swallow whole.   atorvastatin (LIPITOR) 40 mg, Oral, Daily at bedtime   fenofibrate 160 mg, Oral, Daily   glucose blood test strip Use as directed once daily   metFORMIN (GLUCOPHAGE) 1,000 mg, Oral, 2 times daily with meals   Microlet Lancets MISC Use as directed once daily   Multiple  Vitamin (MULTIVITAMIN) tablet 1 tablet, Oral, 2 times daily   omeprazole (PRILOSEC) 40 mg, Oral, Daily before breakfast   pioglitazone (ACTOS) 30 mg, Oral, Daily       Objective:   Physical Exam BP 130/84   Pulse 77   Temp 97.6 F (36.4 C) (Oral)   Resp 16   Ht 6\' 8"  (2.032 m)   Wt 229 lb (103.9 kg)   SpO2 95%   BMI 25.16 kg/m  General: Well developed, NAD, BMI noted Neck: No  thyromegaly  HEENT:  Normocephalic . Face symmetric, atraumatic Lungs:  CTA B Normal respiratory effort, no intercostal retractions, no accessory muscle use. Heart: RRR,  no murmur.  Abdomen:  Not distended, soft, non-tender. No rebound or rigidity.   DM foot exam: No edema, good pedal pulses, pinprick examination normal Skin: Exposed areas without rash. Not pale. Not jaundice Neurologic:  alert & oriented X3.  Speech normal, gait appropriate for age and unassisted Strength symmetric and appropriate for age.  Psych: Cognition and judgment appear intact.  Cooperative with normal attention span and concentration.  Behavior appropriate. No anxious or depressed appearing.     Assessment    ASSESSMENT DM A1c 6.5 (9-2 20) HTN  dx in his 11 Dyslipidemia Back pain  GERD: EGD 10-2017, severe ulcerative esophagitis.  BX negative for H. pylori or dysplasia. E.D. h/o low testosterone   PLAN Here for CPX -Td 2020 - PNM 23: 12/2020 - vaccines I recommend:  shingrex, covid vax booster, flu shot , RSV - CCS: cscope 2012, C-scope 10-2021, next 10 years, no polyps - prostate ca screen: No FH, no symptoms, check a PSA - Diet and exercise : Encouraged to take walks more frequently.  Diet is okay. - Labs: BMP AST ALT FLP CBC A1c PSA micro  - Healthcare POA: Information provided DM: On metformin, pioglitazone, feet exam negative.  Checking labs. HTN: On no meds, BP is very good.  Recommend to monitor BPs. Dyslipidemia: On atorvastatin and fenofibrate.  Checking labs FH CAD: Father, age 45, controlling CV  RF.  On aspirin. RTC 6 months

## 2023-01-29 NOTE — Patient Instructions (Addendum)
Vaccines I recommend: Covid booster RSV vaccine Shingrix (shingles)  Check the  blood pressure regularly BP GOAL is between 110/65 and  135/85. If it is consistently higher or lower, let me know  Exercise goal: 3 hours a week   GO TO THE LAB : Get the blood work     GO TO THE FRONT DESK, PLEASE SCHEDULE YOUR APPOINTMENTS Come back for a checkup in 6 months    "Health Care Power of attorney" ,  "Living will" (Advance care planning documents)  If you already have a living will or healthcare power of attorney, is recommended you bring the copy to be scanned in your chart.   The document will be available to all the doctors you see in the system.  Advance care planning is a process that supports adults in  understanding and sharing their preferences regarding future medical care.  The patient's preferences are recorded in documents called Advance Directives and the can be modified at any time while the patient is in full mental capacity.   If you don't have one, please consider create one.      More information at: [email or web address]

## 2023-01-29 NOTE — Assessment & Plan Note (Signed)
Here for CPX -Td 2020 - PNM 23: 12/2020 - vaccines I recommend:  shingrex, covid vax booster, flu shot , RSV - CCS: cscope 2012, C-scope 10-2021, next 10 years, no polyps - prostate ca screen: No FH, no symptoms, check a PSA - Diet and exercise : Encouraged to take walks more frequently.  Diet is okay. - Labs: BMP AST ALT FLP CBC A1c PSA micro  - Healthcare POA: Information provided

## 2023-01-29 NOTE — Assessment & Plan Note (Signed)
Here for CPX DM: On metformin, pioglitazone, feet exam negative.  Checking labs. HTN: On no meds, BP is very good.  Recommend to monitor BPs. Dyslipidemia: On atorvastatin and fenofibrate.  Checking labs FH CAD: Father, age 64, controlling CV RF.  On aspirin. RTC 6 months

## 2023-01-30 ENCOUNTER — Other Ambulatory Visit (HOSPITAL_BASED_OUTPATIENT_CLINIC_OR_DEPARTMENT_OTHER): Payer: Self-pay

## 2023-01-30 MED ORDER — EZETIMIBE 10 MG PO TABS
10.0000 mg | ORAL_TABLET | Freq: Every day | ORAL | 3 refills | Status: DC
  Filled 2023-01-30: qty 90, 90d supply, fill #0
  Filled 2023-03-12 – 2023-05-11 (×2): qty 90, 90d supply, fill #1
  Filled 2023-08-17: qty 90, 90d supply, fill #2
  Filled 2023-11-10: qty 90, 90d supply, fill #3

## 2023-01-30 NOTE — Addendum Note (Signed)
Addended by: Conrad [place] D on: 01/30/2023 12:45 PM   Modules accepted: Orders

## 2023-01-31 ENCOUNTER — Other Ambulatory Visit (HOSPITAL_BASED_OUTPATIENT_CLINIC_OR_DEPARTMENT_OTHER): Payer: Self-pay

## 2023-01-31 ENCOUNTER — Other Ambulatory Visit: Payer: Self-pay

## 2023-03-12 ENCOUNTER — Other Ambulatory Visit: Payer: Self-pay

## 2023-03-12 ENCOUNTER — Other Ambulatory Visit (HOSPITAL_BASED_OUTPATIENT_CLINIC_OR_DEPARTMENT_OTHER): Payer: Self-pay

## 2023-03-12 DIAGNOSIS — H35362 Drusen (degenerative) of macula, left eye: Secondary | ICD-10-CM | POA: Diagnosis not present

## 2023-03-12 DIAGNOSIS — E119 Type 2 diabetes mellitus without complications: Secondary | ICD-10-CM | POA: Diagnosis not present

## 2023-03-12 DIAGNOSIS — H2513 Age-related nuclear cataract, bilateral: Secondary | ICD-10-CM | POA: Diagnosis not present

## 2023-05-11 ENCOUNTER — Other Ambulatory Visit: Payer: Self-pay | Admitting: Internal Medicine

## 2023-05-12 ENCOUNTER — Other Ambulatory Visit: Payer: Self-pay

## 2023-05-12 ENCOUNTER — Other Ambulatory Visit (HOSPITAL_BASED_OUTPATIENT_CLINIC_OR_DEPARTMENT_OTHER): Payer: Self-pay

## 2023-05-12 MED ORDER — FENOFIBRATE 160 MG PO TABS
160.0000 mg | ORAL_TABLET | Freq: Every day | ORAL | 1 refills | Status: DC
  Filled 2023-05-12 – 2023-06-11 (×3): qty 90, 90d supply, fill #0
  Filled 2023-09-16: qty 90, 90d supply, fill #1

## 2023-05-12 MED ORDER — ATORVASTATIN CALCIUM 40 MG PO TABS
40.0000 mg | ORAL_TABLET | Freq: Every day | ORAL | 1 refills | Status: DC
  Filled 2023-05-12: qty 90, 90d supply, fill #0
  Filled 2023-08-17: qty 90, 90d supply, fill #1

## 2023-05-12 MED ORDER — METFORMIN HCL 1000 MG PO TABS
1000.0000 mg | ORAL_TABLET | Freq: Two times a day (BID) | ORAL | 1 refills | Status: DC
  Filled 2023-05-12: qty 180, 90d supply, fill #0
  Filled 2023-08-17: qty 180, 90d supply, fill #1

## 2023-05-13 ENCOUNTER — Other Ambulatory Visit (HOSPITAL_BASED_OUTPATIENT_CLINIC_OR_DEPARTMENT_OTHER): Payer: Self-pay

## 2023-05-14 ENCOUNTER — Other Ambulatory Visit (HOSPITAL_BASED_OUTPATIENT_CLINIC_OR_DEPARTMENT_OTHER): Payer: Self-pay

## 2023-06-12 ENCOUNTER — Other Ambulatory Visit: Payer: Self-pay

## 2023-06-12 ENCOUNTER — Other Ambulatory Visit (HOSPITAL_BASED_OUTPATIENT_CLINIC_OR_DEPARTMENT_OTHER): Payer: Self-pay

## 2023-08-06 ENCOUNTER — Ambulatory Visit: Payer: Commercial Managed Care - PPO | Admitting: Internal Medicine

## 2023-08-06 ENCOUNTER — Encounter: Payer: Self-pay | Admitting: Internal Medicine

## 2023-08-06 VITALS — BP 126/74 | HR 72 | Temp 97.6°F | Resp 18 | Ht >= 80 in | Wt 236.0 lb

## 2023-08-06 DIAGNOSIS — I10 Essential (primary) hypertension: Secondary | ICD-10-CM | POA: Diagnosis not present

## 2023-08-06 DIAGNOSIS — E781 Pure hyperglyceridemia: Secondary | ICD-10-CM

## 2023-08-06 DIAGNOSIS — E119 Type 2 diabetes mellitus without complications: Secondary | ICD-10-CM | POA: Diagnosis not present

## 2023-08-06 DIAGNOSIS — Z7984 Long term (current) use of oral hypoglycemic drugs: Secondary | ICD-10-CM | POA: Diagnosis not present

## 2023-08-06 LAB — LIPID PANEL
Cholesterol: 142 mg/dL (ref 0–200)
HDL: 29.7 mg/dL — ABNORMAL LOW (ref >39.00)
LDL Cholesterol: 79 mg/dL (ref 0–99)
NonHDL: 112.37
Total CHOL/HDL Ratio: 5
Triglycerides: 169 mg/dL — ABNORMAL HIGH (ref 0.0–149.0)
VLDL: 33.8 mg/dL (ref 0.0–40.0)

## 2023-08-06 LAB — BASIC METABOLIC PANEL
BUN: 14 mg/dL (ref 6–23)
CO2: 27 meq/L (ref 19–32)
Calcium: 9.7 mg/dL (ref 8.4–10.5)
Chloride: 104 meq/L (ref 96–112)
Creatinine, Ser: 0.89 mg/dL (ref 0.40–1.50)
GFR: 90.54 mL/min (ref >60.00)
Glucose, Bld: 112 mg/dL — ABNORMAL HIGH (ref 70–99)
Potassium: 4.4 meq/L (ref 3.5–5.1)
Sodium: 139 meq/L (ref 135–145)

## 2023-08-06 LAB — HEMOGLOBIN A1C: Hgb A1c MFr Bld: 6.4 % (ref 4.6–6.5)

## 2023-08-06 LAB — ALT: ALT: 20 U/L (ref 0–53)

## 2023-08-06 LAB — AST: AST: 23 U/L (ref 0–37)

## 2023-08-06 NOTE — Patient Instructions (Addendum)
Vaccines I recommend: Covid booster Shingrix (shingles)  Diabetes: You can check your sugars at different times, they right times to do it are: - early in AM fasting  ( blood sugar goal 70-130) - 2 hours after a meal (blood sugar goal less than 180)     GO TO THE LAB : Get the blood work     Next visit with me in 6 months for a physical exam     Please schedule it at the front desk

## 2023-08-06 NOTE — Progress Notes (Signed)
   Subjective:    Patient ID: Seth Pineda, male    DOB: [birth year], 62 y.o.   MRN: [id number]  DOS:  08/06/2023 Type of visit - description: f/u   Chronic medical problems addressed. He is feeling well.  Review of Systems See above   Past Medical History:  Diagnosis Date   Diabetes mellitus without complication (HCC)    Essential hypertension, benign 01/03/2009   Qualifier: Diagnosis of  By: Tawanna Cooler MD, Eugenio Hoes    GERD (gastroesophageal reflux disease)    no longer on Pepcid     Hyperlipidemia    Hypertension     Past Surgical History:  Procedure Laterality Date   NO PAST SURGERIES      Current Outpatient Medications  Medication Instructions   aspirin EC 81 mg, Oral, Daily, Swallow whole.   atorvastatin (LIPITOR) 40 mg, Oral, Daily at bedtime   ezetimibe (ZETIA) 10 mg, Oral, Daily   fenofibrate 160 mg, Oral, Daily   glucose blood test strip Use as directed once daily   metFORMIN (GLUCOPHAGE) 1,000 mg, Oral, 2 times daily with meals   Microlet Lancets MISC Use as directed once daily   Multiple Vitamin (MULTIVITAMIN) tablet 1 tablet, Oral, 2 times daily   omeprazole (PRILOSEC) 40 mg, Oral, Daily before breakfast   pioglitazone (ACTOS) 30 mg, Oral, Daily       Objective:   Physical Exam BP 126/74   Pulse 72   Temp 97.6 F (36.4 C) (Oral)   Resp 18   Ht 6\' 8"  (2.032 m)   Wt 236 lb (107 kg)   SpO2 95%   BMI 25.93 kg/m  General:   Well developed, NAD, BMI noted. HEENT:  Normocephalic . Face symmetric, atraumatic Lungs:  CTA B Normal respiratory effort, no intercostal retractions, no accessory muscle use. Heart: RRR,  no murmur.  Lower extremities: no pretibial edema bilaterally  Skin: Not pale. Not jaundice Neurologic:  alert & oriented X3.  Speech normal, gait appropriate for age and unassisted Psych--  Cognition and judgment appear intact.  Cooperative with normal attention span and concentration.  Behavior appropriate. No anxious or depressed  appearing.      Assessment     ASSESSMENT DM A1c 6.5 (9-2 20) HTN dx in his 38 Dyslipidemia Back pain  GERD: EGD 10-2017, severe ulcerative esophagitis.  BX negative for H. pylori or dysplasia. E.D. h/o low testosterone   PLAN DM: Good compliance with metformin, pioglitazone, ambulatory CBGs in the morning in the 120s. We talk about a healthy diet, he is active but rec to increase a little his physical activity. Check a BMP and A1c Dyslipidemia: On atorvastatin and fenofibrate, LDL 6 months ago was 118, Zetia was added.  Good compliance, checking labs. Preventive care: Had a flu shot, recommend a COVID-vaccine, declines, pro>cons d/w pt . RTC 6 months CPX

## 2023-08-06 NOTE — Assessment & Plan Note (Signed)
DM: Good compliance with metformin, pioglitazone, ambulatory CBGs in the morning in the 120s. We talk about a healthy diet, he is active but rec to increase a little his physical activity. Check a BMP and A1c Dyslipidemia: On atorvastatin and fenofibrate, LDL 6 months ago was 118, Zetia was added.  Good compliance, checking labs. Preventive care: Had a flu shot, recommend a COVID-vaccine, declines, pro>cons d/w pt . RTC 6 months CPX

## 2023-10-07 ENCOUNTER — Other Ambulatory Visit (HOSPITAL_BASED_OUTPATIENT_CLINIC_OR_DEPARTMENT_OTHER): Payer: Self-pay

## 2023-10-07 MED ORDER — IBUPROFEN 800 MG PO TABS
800.0000 mg | ORAL_TABLET | Freq: Four times a day (QID) | ORAL | 0 refills | Status: DC
  Filled 2023-10-07: qty 20, 5d supply, fill #0

## 2023-10-07 MED ORDER — CHLORHEXIDINE GLUCONATE 0.12 % MT SOLN
OROMUCOSAL | 4 refills | Status: DC
  Filled 2023-10-07: qty 473, 30d supply, fill #0

## 2023-10-07 MED ORDER — PENICILLIN V POTASSIUM 500 MG PO TABS
ORAL_TABLET | ORAL | 0 refills | Status: DC
  Filled 2023-10-07: qty 20, 5d supply, fill #0

## 2023-10-15 DIAGNOSIS — E119 Type 2 diabetes mellitus without complications: Secondary | ICD-10-CM | POA: Diagnosis not present

## 2023-10-15 DIAGNOSIS — H35362 Drusen (degenerative) of macula, left eye: Secondary | ICD-10-CM | POA: Diagnosis not present

## 2023-10-15 DIAGNOSIS — H524 Presbyopia: Secondary | ICD-10-CM | POA: Diagnosis not present

## 2023-10-15 DIAGNOSIS — H5203 Hypermetropia, bilateral: Secondary | ICD-10-CM | POA: Diagnosis not present

## 2023-10-15 DIAGNOSIS — H35371 Puckering of macula, right eye: Secondary | ICD-10-CM | POA: Diagnosis not present

## 2023-10-15 DIAGNOSIS — H2513 Age-related nuclear cataract, bilateral: Secondary | ICD-10-CM | POA: Diagnosis not present

## 2023-10-15 LAB — HM DIABETES EYE EXAM

## 2023-11-10 ENCOUNTER — Other Ambulatory Visit: Payer: Self-pay | Admitting: Internal Medicine

## 2023-11-11 ENCOUNTER — Other Ambulatory Visit (HOSPITAL_BASED_OUTPATIENT_CLINIC_OR_DEPARTMENT_OTHER): Payer: Self-pay

## 2023-11-11 ENCOUNTER — Other Ambulatory Visit: Payer: Self-pay

## 2023-11-11 MED ORDER — FENOFIBRATE 160 MG PO TABS
160.0000 mg | ORAL_TABLET | Freq: Every day | ORAL | 1 refills | Status: DC
  Filled 2023-11-11 – 2023-12-30 (×2): qty 90, 90d supply, fill #0
  Filled 2024-03-26: qty 90, 90d supply, fill #1

## 2023-11-11 MED ORDER — ATORVASTATIN CALCIUM 40 MG PO TABS
40.0000 mg | ORAL_TABLET | Freq: Every day | ORAL | 1 refills | Status: DC
  Filled 2023-11-11: qty 90, 90d supply, fill #0
  Filled 2024-03-01: qty 90, 90d supply, fill #1

## 2023-11-11 MED ORDER — METFORMIN HCL 1000 MG PO TABS
1000.0000 mg | ORAL_TABLET | Freq: Two times a day (BID) | ORAL | 1 refills | Status: DC
  Filled 2023-11-11: qty 180, 90d supply, fill #0
  Filled 2024-03-01: qty 180, 90d supply, fill #1

## 2023-11-11 MED ORDER — PIOGLITAZONE HCL 30 MG PO TABS
30.0000 mg | ORAL_TABLET | Freq: Every day | ORAL | 1 refills | Status: DC
  Filled 2023-11-11 – 2023-12-30 (×2): qty 90, 90d supply, fill #0
  Filled 2024-03-26: qty 90, 90d supply, fill #1

## 2023-11-26 ENCOUNTER — Other Ambulatory Visit: Payer: Self-pay

## 2023-12-31 ENCOUNTER — Other Ambulatory Visit (HOSPITAL_BASED_OUTPATIENT_CLINIC_OR_DEPARTMENT_OTHER): Payer: Self-pay

## 2024-02-04 ENCOUNTER — Encounter: Payer: Self-pay | Admitting: Internal Medicine

## 2024-02-04 ENCOUNTER — Ambulatory Visit: Payer: Commercial Managed Care - PPO | Admitting: Internal Medicine

## 2024-02-04 VITALS — BP 122/80 | HR 70 | Temp 98.0°F | Resp 16 | Ht >= 80 in | Wt 234.4 lb

## 2024-02-04 DIAGNOSIS — Z7984 Long term (current) use of oral hypoglycemic drugs: Secondary | ICD-10-CM | POA: Diagnosis not present

## 2024-02-04 DIAGNOSIS — E119 Type 2 diabetes mellitus without complications: Secondary | ICD-10-CM

## 2024-02-04 DIAGNOSIS — Z0000 Encounter for general adult medical examination without abnormal findings: Secondary | ICD-10-CM

## 2024-02-04 DIAGNOSIS — I10 Essential (primary) hypertension: Secondary | ICD-10-CM | POA: Diagnosis not present

## 2024-02-04 DIAGNOSIS — E781 Pure hyperglyceridemia: Secondary | ICD-10-CM | POA: Diagnosis not present

## 2024-02-04 DIAGNOSIS — L989 Disorder of the skin and subcutaneous tissue, unspecified: Secondary | ICD-10-CM

## 2024-02-04 LAB — LIPID PANEL
Cholesterol: 153 mg/dL (ref 0–200)
HDL: 35.9 mg/dL — ABNORMAL LOW (ref >39.00)
LDL Cholesterol: 83 mg/dL (ref 0–99)
NonHDL: 116.75
Total CHOL/HDL Ratio: 4
Triglycerides: 168 mg/dL — ABNORMAL HIGH (ref 0.0–149.0)
VLDL: 33.6 mg/dL (ref 0.0–40.0)

## 2024-02-04 LAB — CBC WITH DIFFERENTIAL/PLATELET
Basophils Absolute: 0.1 10*3/uL (ref 0.0–0.1)
Basophils Relative: 0.7 % (ref 0.0–3.0)
Eosinophils Absolute: 0.4 10*3/uL (ref 0.0–0.7)
Eosinophils Relative: 5.1 % — ABNORMAL HIGH (ref 0.0–5.0)
HCT: 42.2 % (ref 39.0–52.0)
Hemoglobin: 13.9 g/dL (ref 13.0–17.0)
Lymphocytes Relative: 30.9 % (ref 12.0–46.0)
Lymphs Abs: 2.3 10*3/uL (ref 0.7–4.0)
MCHC: 33.1 g/dL (ref 30.0–36.0)
MCV: 92.6 fl (ref 78.0–100.0)
Monocytes Absolute: 0.7 10*3/uL (ref 0.1–1.0)
Monocytes Relative: 8.9 % (ref 3.0–12.0)
Neutro Abs: 4.1 10*3/uL (ref 1.4–7.7)
Neutrophils Relative %: 54.4 % (ref 43.0–77.0)
Platelets: 206 10*3/uL (ref 150.0–400.0)
RBC: 4.56 Mil/uL (ref 4.22–5.81)
RDW: 14.4 % (ref 11.5–15.5)
WBC: 7.5 10*3/uL (ref 4.0–10.5)

## 2024-02-04 LAB — MICROALBUMIN / CREATININE URINE RATIO
Creatinine,U: 180.9 mg/dL
Microalb Creat Ratio: 4.7 mg/g (ref 0.0–30.0)
Microalb, Ur: 0.9 mg/dL (ref 0.0–1.9)

## 2024-02-04 LAB — BASIC METABOLIC PANEL WITH GFR
BUN: 15 mg/dL (ref 6–23)
CO2: 25 meq/L (ref 19–32)
Calcium: 9.5 mg/dL (ref 8.4–10.5)
Chloride: 105 meq/L (ref 96–112)
Creatinine, Ser: 0.97 mg/dL (ref 0.40–1.50)
GFR: 82.19 mL/min (ref >60.00)
Glucose, Bld: 121 mg/dL — ABNORMAL HIGH (ref 70–99)
Potassium: 4.1 meq/L (ref 3.5–5.1)
Sodium: 138 meq/L (ref 135–145)

## 2024-02-04 LAB — HEMOGLOBIN A1C: Hgb A1c MFr Bld: 6.3 % (ref 4.6–6.5)

## 2024-02-04 LAB — PSA: PSA: 0.23 ng/mL (ref 0.10–4.00)

## 2024-02-04 NOTE — Progress Notes (Unsigned)
 Subjective:    Patient ID: Seth Pineda, male    DOB: 1961/01/09, 63 y.o.   MRN: [id number]  DOS:  02/04/2024 Type of visit - description: CPX  For CPX. In general feels well. For a while has on and off skin lesion at the posterior side of the neck. It crusts on and off.  No bleeding noted.  Review of Systems See above   Past Medical History:  Diagnosis Date   Diabetes mellitus without complication (HCC)    Essential hypertension, benign 01/03/2009   Qualifier: Diagnosis of  By: Ena Harries MD, Viktoria Gray    GERD (gastroesophageal reflux disease)    no longer on Pepcid     Hyperlipidemia    Hypertension     Past Surgical History:  Procedure Laterality Date   NO PAST SURGERIES      Current Outpatient Medications  Medication Instructions   aspirin EC 81 mg, Daily   atorvastatin  (LIPITOR) 40 mg, Oral, Daily at bedtime   chlorhexidine  (PERIDEX ) 0.12 % solution Swish with 1/2 capful before bedtime and spit starting 24 hours after surgery   ezetimibe  (ZETIA ) 10 mg, Oral, Daily   fenofibrate  160 mg, Oral, Daily   glucose blood test strip Use as directed once daily   ibuprofen  (ADVIL ) 800 MG tablet Take 1 tablet by mouth every 6-8 hours for mild pain   metFORMIN  (GLUCOPHAGE ) 1,000 mg, Oral, 2 times daily with meals   Microlet Lancets MISC Use as directed once daily   Multiple Vitamin (MULTIVITAMIN) tablet 1 tablet, 2 times daily   omeprazole  (PRILOSEC) 40 mg, Oral, Daily before breakfast   penicillin  v potassium (VEETID) 500 MG tablet Take 2 tablets now and then take 1 tablet every 6 hours until gone   pioglitazone  (ACTOS ) 30 mg, Oral, Daily       Objective:   Physical Exam BP 122/80   Pulse 70   Temp 98 F (36.7 C) (Oral)   Resp 16   Ht 6\' 8"  (2.032 m)   Wt 234 lb 6 oz (106.3 kg)   SpO2 97%   BMI 25.75 kg/m  General: Well developed, NAD, BMI noted Neck: No  thyromegaly  HEENT:  Normocephalic . Face symmetric, atraumatic Lungs:  CTA B Normal respiratory effort,  no intercostal retractions, no accessory muscle use. Heart: RRR,  no murmur.  Abdomen:  Not distended, soft, non-tender. No rebound or rigidity.   Lower extremities: no pretibial edema bilaterally  Skin: Right side of the posterior neck, has a 4 mm skin lesion, scaly, no ulcers, no hyperpigmentation.  See picture neurologic:  alert & oriented X3.  Speech normal, gait appropriate for age and unassisted Strength symmetric and appropriate for age.  Psych: Cognition and judgment appear intact.  Cooperative with normal attention span and concentration.  Behavior appropriate. No anxious or depressed appearing.     Assessment     ASSESSMENT DM A1c 6.5 (9-2 20) HTN dx in his 38 Dyslipidemia Back pain  GERD: EGD 10-2017, severe ulcerative esophagitis.  BX negative for H. pylori or dysplasia. E.D. h/o low testosterone    PLAN -Td 2020 - PNM 23: 12/2020 - vaccines I recommend: PNM 20,  shingrex, covid vax booster, flu shot q fall  - CCS: cscope 2012, C-scope 10-2021, next 10 years, no polyps - prostate ca screen: No FH, no symptoms, check a PSA - Diet and exercise : Encouraged to take walks more frequently.  Diet is okay. - Labs: BMP FLP CBC A1c micro PSA - Healthcare  POA: Information provided   Other issues addressed today: DM: On pioglitazone , metformin .  Ambulatory CBGs typically 120, range 98-1 38.  Check labs, further advised for results. HTN: On lifestyle control, ambulatory BPs are infrequent but typically okay.  Checking labs Dyslipidemia: On atorvastatin , Zetia , fenofibrate 's.  Check FLP. Skin lesion: SCC?  Referred to dermatology. History of GERD: On Prilosec, denies dysphagia.  Asymptomatic. RTC 6 months  ==== DM: Good compliance with metformin , pioglitazone , ambulatory CBGs in the morning in the 120s. We talk about a healthy diet, he is active but rec to increase a little his physical activity. Check a BMP and A1c Dyslipidemia: On atorvastatin  and fenofibrate , LDL 6  months ago was 118, Zetia  was added.  Good compliance, checking labs. Preventive care: Had a flu shot, recommend a COVID-vaccine, declines, pro>cons d/w pt . RTC 6 months CPX

## 2024-02-04 NOTE — Patient Instructions (Signed)
 Vaccines I recommend: Pneumonia shot (PNM 20) Shingles shot Flu shot every fall COVID booster is a option     Check the  blood pressure regularly Blood pressure goal:  between 110/65 and  135/85. If it is consistently higher or lower, let me know   Diabetes: You can check your sugars at different times  - early in AM fasting  ( blood sugar goal 70-130) - 2 hours after a meal (blood sugar goal less than 180)     GO TO THE LAB :  Get the blood work   Your results will be posted on MyChart with my comments  Next office visit for a checkup in 6 months Please make an appointment before you leave today      "Health Care Power of attorney" (Also know as a  "Living will" or  Advance care planning documents)  If you already have a living will or healthcare power of attorney, is recommended you bring the copy to be scanned in your chart.   The document will be available to all the doctors you see in the system.  If you are over 69 y/o and don't have the document, please read:  Advance care planning is a process that supports adults in  understanding and sharing their preferences regarding future medical care.  The patient's preferences are recorded in documents called Advance Directives and the can be modified at any time while the patient is in full mental capacity.     More information at: [email or web address]

## 2024-02-05 ENCOUNTER — Encounter: Payer: Self-pay | Admitting: Internal Medicine

## 2024-02-05 NOTE — Assessment & Plan Note (Signed)
 Here for CPX -Td 2020 - PNM 23: 12/2020 - vaccines I recommend: PNM 20,  shingrex, covid vax booster, flu shot q fall  - CCS: cscope 2012, C-scope 10-2021, next 10 years, no polyps - prostate ca screen: No FH, no symptoms, check a PSA - Diet and exercise : Encouraged to take walks more frequently.  Diet is okay. - Labs: BMP FLP CBC A1c micro PSA - Healthcare POA: Information provided

## 2024-02-05 NOTE — Assessment & Plan Note (Signed)
 Here for CPX  Other issues addressed today: DM: On pioglitazone , metformin .  Ambulatory CBGs typically 120, range 98-138.  Check labs, further advised for results. HTN: On lifestyle control, ambulatory BPs are infrequent but typically okay.  Checking labs Dyslipidemia: On atorvastatin , Zetia , fenofibrates.  Check FLP. Skin lesion: SCC?  Referred to dermatology. History of GERD: On Prilosec, denies dysphagia.  Asymptomatic. RTC 6 months

## 2024-02-06 ENCOUNTER — Ambulatory Visit: Payer: Self-pay | Admitting: Internal Medicine

## 2024-03-01 ENCOUNTER — Other Ambulatory Visit (HOSPITAL_BASED_OUTPATIENT_CLINIC_OR_DEPARTMENT_OTHER): Payer: Self-pay

## 2024-03-01 ENCOUNTER — Other Ambulatory Visit: Payer: Self-pay | Admitting: Internal Medicine

## 2024-03-01 ENCOUNTER — Other Ambulatory Visit: Payer: Self-pay

## 2024-03-01 MED ORDER — EZETIMIBE 10 MG PO TABS
10.0000 mg | ORAL_TABLET | Freq: Every day | ORAL | 1 refills | Status: DC
  Filled 2024-03-01: qty 90, 90d supply, fill #0
  Filled 2024-05-24 – 2024-05-31 (×2): qty 90, 90d supply, fill #1

## 2024-03-15 ENCOUNTER — Encounter: Payer: Self-pay | Admitting: Dermatology

## 2024-03-15 ENCOUNTER — Ambulatory Visit: Admitting: Dermatology

## 2024-03-15 VITALS — BP 101/46 | HR 85

## 2024-03-15 DIAGNOSIS — D492 Neoplasm of unspecified behavior of bone, soft tissue, and skin: Secondary | ICD-10-CM

## 2024-03-15 DIAGNOSIS — C4442 Squamous cell carcinoma of skin of scalp and neck: Secondary | ICD-10-CM

## 2024-03-15 DIAGNOSIS — W908XXA Exposure to other nonionizing radiation, initial encounter: Secondary | ICD-10-CM | POA: Diagnosis not present

## 2024-03-15 DIAGNOSIS — L578 Other skin changes due to chronic exposure to nonionizing radiation: Secondary | ICD-10-CM

## 2024-03-15 DIAGNOSIS — D485 Neoplasm of uncertain behavior of skin: Secondary | ICD-10-CM

## 2024-03-15 NOTE — Patient Instructions (Signed)

## 2024-03-15 NOTE — Progress Notes (Signed)
   New Patient Visit   Subjective  Seth Pineda is a 63 y.o. male who presents for the following: Spot of concern on right neck, present for about  1 year. Itchy and bleeds at times.  No hx of skin cancer.  The following portions of the chart were reviewed this encounter and updated as appropriate: medications, allergies, medical history  Review of Systems:  No other skin or systemic complaints except as noted in HPI or Assessment and Plan.  Objective  Well appearing patient in no apparent distress; mood and affect are within normal limits.  A focused examination was performed of the following areas: Neck Face  Relevant exam findings are noted in the Assessment and Plan.  Right Anterior Neck 7mm hyperkeratotic papule    Assessment & Plan   ACTINIC DAMAGE - chronic, secondary to cumulative UV radiation exposure/sun exposure over time - diffuse scaly erythematous macules with underlying dyspigmentation - Recommend daily broad spectrum sunscreen SPF 30+ to sun-exposed areas, reapply every 2 hours as needed.  - Recommend staying in the shade or wearing long sleeves, sun glasses (UVA+UVB protection) and wide brim hats (4-inch brim around the entire circumference of the hat). - Call for new or changing lesions.  NEOPLASM OF UNCERTAIN BEHAVIOR OF SKIN Right Anterior Neck Skin / nail biopsy Type of biopsy: tangential   Informed consent: discussed and consent obtained   Timeout: patient name, date of birth, surgical site, and procedure verified   Procedure prep:  Patient was prepped and draped in usual sterile fashion Prep type:  Isopropyl alcohol Anesthesia: the lesion was anesthetized in a standard fashion   Anesthetic:  1% lidocaine w/ epinephrine 1-100,000 buffered w/ 8.4% NaHCO3 Instrument used: DermaBlade   Hemostasis achieved with: aluminum chloride   Outcome: patient tolerated procedure well   Post-procedure details: sterile dressing applied and wound care instructions  given   Dressing type: petrolatum gauze and bandage    Specimen 1 - Surgical pathology Differential Diagnosis: r/o nmsc vs other  Check Margins: No ACTINIC SKIN DAMAGE    Return for based on biopsy results.  I, Berwyn Lesches, Surg Tech III, am acting as scribe for RUFUS CHRISTELLA HOLY, MD.   Documentation: I have reviewed the above documentation for accuracy and completeness, and I agree with the above.  RUFUS CHRISTELLA HOLY, MD

## 2024-03-16 LAB — SURGICAL PATHOLOGY

## 2024-03-17 ENCOUNTER — Ambulatory Visit: Payer: Self-pay | Admitting: Dermatology

## 2024-03-17 NOTE — Telephone Encounter (Signed)
-----   Message from [facility] PACI sent at 03/17/2024  1:16 PM EDT ----- - W Diff SCC- right neck- Mohs  Please call patient to discuss diagnosis and schedule for Mohs surgery. ----- Message ----- From: Interface, Lab In Three Zero One Sent: 03/16/2024   5:42 PM EDT To: Rufus CHRISTELLA Holy, MD

## 2024-03-17 NOTE — Telephone Encounter (Signed)
 Pt unable to talk so he will call back for results

## 2024-03-26 ENCOUNTER — Other Ambulatory Visit: Payer: Self-pay

## 2024-04-01 ENCOUNTER — Encounter: Payer: Self-pay | Admitting: Dermatology

## 2024-04-05 ENCOUNTER — Encounter: Payer: Self-pay | Admitting: Dermatology

## 2024-04-05 ENCOUNTER — Ambulatory Visit: Admitting: Dermatology

## 2024-04-05 ENCOUNTER — Other Ambulatory Visit (HOSPITAL_BASED_OUTPATIENT_CLINIC_OR_DEPARTMENT_OTHER): Payer: Self-pay

## 2024-04-05 DIAGNOSIS — L814 Other melanin hyperpigmentation: Secondary | ICD-10-CM | POA: Diagnosis not present

## 2024-04-05 DIAGNOSIS — L579 Skin changes due to chronic exposure to nonionizing radiation, unspecified: Secondary | ICD-10-CM | POA: Diagnosis not present

## 2024-04-05 DIAGNOSIS — C4442 Squamous cell carcinoma of skin of scalp and neck: Secondary | ICD-10-CM

## 2024-04-05 DIAGNOSIS — C4492 Squamous cell carcinoma of skin, unspecified: Secondary | ICD-10-CM

## 2024-04-05 MED ORDER — OXYCODONE HCL 5 MG PO TABS
5.0000 mg | ORAL_TABLET | Freq: Four times a day (QID) | ORAL | 0 refills | Status: DC | PRN
  Filled 2024-04-05: qty 8, 2d supply, fill #0

## 2024-04-05 NOTE — Patient Instructions (Signed)

## 2024-04-05 NOTE — Progress Notes (Signed)
 Follow-Up Visit   Subjective  Seth Pineda is a 63 y.o. 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 Corey.   The following portions of the chart were reviewed this encounter and updated as appropriate: medications, allergies, medical history  Review of Systems:  No other skin or systemic complaints except as noted in HPI or Assessment and Plan.  Objective  Well appearing patient in no apparent distress; mood and affect are within normal limits.  A focused examination was performed of the following areas: Right anterior neck Relevant physical exam findings are noted in the Assessment and Plan.             right anterior neck Healing biopsy site  Assessment & Plan   SQUAMOUS CELL CARCINOMA OF SKIN right anterior neck Mohs surgery  Consent obtained: written  Anticoagulation: Was the anticoagulation regimen changed prior to Mohs? No    Anesthesia: Anesthesia method: local infiltration Local anesthetic: lidocaine 1% WITH epi  Procedure Details: Timeout: pre-procedure verification complete Procedure Prep: patient was prepped and draped in usual sterile fashion Biopsy accession number: [id number] Pre-Op diagnosis: squamous cell carcinoma SCC subtype: well differentiated MohsAIQ Surgical site (if tumor spans multiple areas, please select predominant area): neck Surgery side: right Surgical site (from skin exam): right anterior neck Pre-operative length (cm): 1 Pre-operative width (cm): 0.7 Indications for Mohs surgery: anatomic location where tissue conservation is critical  Micrographic Surgery Details: Post-operative length (cm): 2 Post-operative width (cm): 1.3 Number of Mohs stages: 1 Cumulative additional sections past 5 per stage: 0 Post surgery depth of defect: subcutaneous fat  Stage 1    Tumor features identified on Mohs section: no tumor  identified  Reconstruction: Was the defect reconstructed? Yes   Was reconstruction performed by the same Mohs surgeon? Yes   Setting of reconstruction: outpatient office When was reconstruction performed? same day Type of reconstruction: linear  Skin repair Complexity:  Complex Final length (cm):  4.3 Informed consent: discussed and consent obtained   Timeout: patient name, date of birth, surgical site, and procedure verified   Procedure prep:  Patient was prepped and draped in usual sterile fashion Prep type:  Chlorhexidine  Anesthesia: the lesion was anesthetized in a standard fashion   Anesthetic:  1% lidocaine w/ epinephrine 1-100,000 buffered w/ 8.4% NaHCO3 Reason for type of repair: preserve normal anatomical and functional relationships and allow side-to-side closure without requiring a flap or graft   Undermining: area extensively undermined   Subcutaneous layers (deep stitches):  Suture size:  4-0 Suture type: Vicryl (polyglactin 910)   Stitches:  Buried vertical mattress Fine/surface layer approximation (top stitches):  Suture size:  6-0 Suture type: fast-absorbing plain gut   Stitches: simple running   Hemostasis achieved with: suture, pressure and electrodesiccation Outcome: patient tolerated procedure well with no complications   Post-procedure details: sterile dressing applied and wound care instructions given   Dressing type: bandage and petrolatum    Related Medications oxyCODONE  (OXY IR/ROXICODONE ) 5 MG immediate release tablet Take 1 tablet (5 mg total) by mouth every 6 (six) hours as needed for up to 8 doses.  Return in about 4 weeks (around 05/03/2024) for S/P MOHS Right Anterior NECK F/U.  LILLETTE Alec Fare, am acting as scribe for RUFUS CHRISTELLA COREY, MD.   04/05/2024  HISTORY OF PRESENT ILLNESS  Seth Pineda is seen in consultation at the request of Dr. Corey for biopsy-proven Well Differentiated  Squamous Cell Carcinoma of the right neck. They note that the  area has been present for about 6 months increasing in size with time.  There is no history of previous treatment.  Reports no other new or changing lesions and has no other complaints today.  Medications and allergies: see patient chart.  Review of systems: Reviewed 8 systems and notable for the above skin cancer.  All other systems reviewed are unremarkable/negative, unless noted in the HPI. Past medical history, surgical history, family history, social history were also reviewed and are noted in the chart/questionnaire.    PHYSICAL EXAMINATION  General: Well-appearing, in no acute distress, alert and oriented x 4. Vitals reviewed in chart (if available).   Skin: Exam reveals a 1.0 x 0.7 cm erythematous papule and biopsy scar on the right neck. There are rhytids, telangiectasias, and lentigines, consistent with photodamage.   Biopsy report(s) reviewed, confirming the diagnosis.   ASSESSMENT  1) Well Differentiated Squamous Cell Carcinoma of the right neck 2) photodamage 3) solar lentigines   PLAN   1. Due to location, size, histology, or recurrence and the likelihood of subclinical extension as well as the need to conserve normal surrounding tissue, the patient was deemed acceptable for Mohs micrographic surgery (MMS).  The nature and purpose of the procedure, associated benefits and risks including recurrence and scarring, possible complications such as pain, infection, and bleeding, and alternative methods of treatment if appropriate were discussed with the patient during consent. The lesion location was verified by the patient, by reviewing previous notes, pathology reports, and by photographs as well as angulation measurements if available.  Informed consent was reviewed and signed by the patient, and timeout was performed at 10:00 AM. See op note below.  2. For the photodamage and solar lentigines, sun protection discussed/information given on OTC sunscreens, and we recommend continued  regular follow-up with primary dermatologist every 6 months or sooner for any growing, bleeding, or changing lesions. 3. Prognosis and future surveillance discussed. 4. Letter with treatment outcome sent to referring provider. 5. Pain acetaminophen/ibuprofen /oxycodone  5 mg  MOHS MICROGRAPHIC SURGERY AND RECONSTRUCTION  Initial size:   1.0 x 0.7 cm Surgical defect/wound size: 2.0 x 1.3 cm Anesthesia:    0.33% lidocaine with 1:200,000 epinephrine EBL:    <5 mL Complications:  None Repair type:   Complex SQ suture:   4-0 Vicryl Cutaneous suture:  6-0 Plain gut Final size of the repair: 4.3 cm  Stages: 1  STAGE I: Anesthesia achieved with 0.5% lidocaine with 1:200,000 epinephrine. ChloraPrep applied. 1 section(s) excised using Mohs technique (this includes total peripheral and deep tissue margin excision and evaluation with frozen sections, excised and interpreted by the same physician). The tumor was first debulked and then excised with an approx. 2mm margin.  Hemostasis was achieved with electrocautery as needed.  The specimen was then oriented, subdivided/relaxed, inked, and processed using Mohs technique.    Frozen section analysis revealed a clear deep and peripheral margin.   Reconstruction  The surgical wound was then cleaned, prepped, and re-anesthetized as above. Wound edges were undermined extensively along at least one entire edge and at a distance equal to or greater than the width of the defect (see wound defect size above) in order to achieve closure and decrease wound tension and anatomic distortion. Redundant tissue repair including standing cone removal was performed. Hemostasis was achieved with electrocautery. Subcutaneous and epidermal tissues were approximated with the above sutures. The surgical site was then lightly scrubbed with sterile, saline-soaked  gauze. The area was then bandaged using Vaseline ointment, non-adherent gauze, gauze pads, and tape to provide an  adequate pressure dressing. The patient tolerated the procedure well, was given detailed written and verbal wound care instructions, and was discharged in good condition.   The patient will follow-up: clear deep and peripheral margin.   Documentation: I have reviewed the above documentation for accuracy and completeness, and I agree with the above.  RUFUS CHRISTELLA HOLY, MD

## 2024-04-06 ENCOUNTER — Other Ambulatory Visit (HOSPITAL_BASED_OUTPATIENT_CLINIC_OR_DEPARTMENT_OTHER): Payer: Self-pay

## 2024-04-06 MED ORDER — CHLORHEXIDINE GLUCONATE 0.12 % MT SOLN
7.5000 mL | Freq: Every day | OROMUCOSAL | 4 refills | Status: DC
  Filled 2024-04-06: qty 473, 16d supply, fill #0

## 2024-04-06 MED ORDER — PENICILLIN V POTASSIUM 500 MG PO TABS
500.0000 mg | ORAL_TABLET | Freq: Four times a day (QID) | ORAL | 0 refills | Status: DC
  Filled 2024-04-06: qty 20, 5d supply, fill #0

## 2024-04-07 ENCOUNTER — Encounter: Payer: Self-pay | Admitting: Dermatology

## 2024-04-14 DIAGNOSIS — E119 Type 2 diabetes mellitus without complications: Secondary | ICD-10-CM | POA: Diagnosis not present

## 2024-04-14 DIAGNOSIS — H35371 Puckering of macula, right eye: Secondary | ICD-10-CM | POA: Diagnosis not present

## 2024-04-14 DIAGNOSIS — H35362 Drusen (degenerative) of macula, left eye: Secondary | ICD-10-CM | POA: Diagnosis not present

## 2024-04-14 DIAGNOSIS — H2513 Age-related nuclear cataract, bilateral: Secondary | ICD-10-CM | POA: Diagnosis not present

## 2024-04-20 ENCOUNTER — Encounter: Admitting: Dermatology

## 2024-05-06 ENCOUNTER — Encounter: Payer: Self-pay | Admitting: Dermatology

## 2024-05-06 ENCOUNTER — Ambulatory Visit (INDEPENDENT_AMBULATORY_CARE_PROVIDER_SITE_OTHER): Admitting: Dermatology

## 2024-05-06 DIAGNOSIS — C4492 Squamous cell carcinoma of skin, unspecified: Secondary | ICD-10-CM

## 2024-05-06 DIAGNOSIS — Z85828 Personal history of other malignant neoplasm of skin: Secondary | ICD-10-CM

## 2024-05-06 DIAGNOSIS — L905 Scar conditions and fibrosis of skin: Secondary | ICD-10-CM

## 2024-05-06 NOTE — Progress Notes (Signed)
   Follow Up Visit   Subjective  Seth Pineda is a 63 y.o. male who presents for the following: follow up from Mohs surgery   The patient presents for follow up from Mohs surgery for a SCC on the right anterior neck, treated on 04/05/24, repaired with linear closure. The patient has been bandaging the wound as directed. The endorse the following concerns: wants to know how to help the bumpiness   The following portions of the chart were reviewed this encounter and updated as appropriate: medications, allergies, medical history  Review of Systems:  No other skin or systemic complaints except as noted in HPI or Assessment and Plan.  Objective  Well appearing patient in no apparent distress; mood and affect are within normal limits.  A focal examination was performed including scalp, head, face and neck. All findings within normal limits unless otherwise noted below.  Healing wound with mild erythema  Relevant physical exam findings are noted in the Assessment and Plan.    Assessment & Plan   Scar s/p Mohs for SCC on the right anterior neck, treated on 04/05/24, repaired with linear closure - Reassured that wound is healing well - No evidence of infection - No swelling, induration, purulence, dehiscence, or tenderness out of proportion to the clinical exam, see photo above - Discussed that scars take up to 12 months to mature from the date of surgery - Recommend SPF 30+ to scar daily to prevent purple color from UV exposure during scar maturation process - Discussed that erythema and raised appearance of scar will fade over the next 4-6 months - OK to start scar massage at 4-6 weeks post-op - Can consider silicone based products for scar healing starting at 6 weeks post-op - Ok to discontinue ointment daily to wound  HISTORY OF SQUAMOUS CELL CARCINOMA OF THE SKIN - No evidence of recurrence today - Recommend regular full body skin exams - Recommend daily broad spectrum sunscreen SPF  30+ to sun-exposed areas, reapply every 2 hours as needed.  - Call if any new or changing lesions are noted between office visits    Return if symptoms worsen or fail to improve, for He already has a FBSC scheduled .  I, Berwyn Lesches, Surg Tech III, am acting as scribe for RUFUS CHRISTELLA HOLY, MD.   Documentation: I have reviewed the above documentation for accuracy and completeness, and I agree with the above.  RUFUS CHRISTELLA HOLY, MD

## 2024-05-06 NOTE — Patient Instructions (Signed)

## 2024-05-24 ENCOUNTER — Other Ambulatory Visit (HOSPITAL_COMMUNITY): Payer: Self-pay

## 2024-05-24 ENCOUNTER — Other Ambulatory Visit: Payer: Self-pay

## 2024-05-24 ENCOUNTER — Other Ambulatory Visit: Payer: Self-pay | Admitting: Internal Medicine

## 2024-05-24 MED ORDER — ATORVASTATIN CALCIUM 40 MG PO TABS
40.0000 mg | ORAL_TABLET | Freq: Every day | ORAL | 1 refills | Status: AC
  Filled 2024-05-24: qty 90, 90d supply, fill #0
  Filled 2024-08-31: qty 90, 90d supply, fill #1

## 2024-05-24 MED ORDER — PIOGLITAZONE HCL 30 MG PO TABS
30.0000 mg | ORAL_TABLET | Freq: Every day | ORAL | 1 refills | Status: AC
  Filled 2024-05-24 – 2024-06-23 (×2): qty 90, 90d supply, fill #0

## 2024-05-25 ENCOUNTER — Other Ambulatory Visit: Payer: Self-pay

## 2024-05-25 ENCOUNTER — Encounter: Payer: Self-pay | Admitting: Pharmacist

## 2024-05-26 ENCOUNTER — Other Ambulatory Visit (HOSPITAL_BASED_OUTPATIENT_CLINIC_OR_DEPARTMENT_OTHER): Payer: Self-pay

## 2024-05-28 ENCOUNTER — Other Ambulatory Visit: Payer: Self-pay

## 2024-05-31 ENCOUNTER — Other Ambulatory Visit: Payer: Self-pay | Admitting: Internal Medicine

## 2024-05-31 ENCOUNTER — Ambulatory Visit: Admitting: Dermatology

## 2024-05-31 ENCOUNTER — Other Ambulatory Visit: Payer: Self-pay

## 2024-05-31 ENCOUNTER — Other Ambulatory Visit (HOSPITAL_BASED_OUTPATIENT_CLINIC_OR_DEPARTMENT_OTHER): Payer: Self-pay

## 2024-05-31 ENCOUNTER — Encounter: Payer: Self-pay | Admitting: Dermatology

## 2024-05-31 DIAGNOSIS — L578 Other skin changes due to chronic exposure to nonionizing radiation: Secondary | ICD-10-CM

## 2024-05-31 DIAGNOSIS — L814 Other melanin hyperpigmentation: Secondary | ICD-10-CM | POA: Diagnosis not present

## 2024-05-31 DIAGNOSIS — Z1283 Encounter for screening for malignant neoplasm of skin: Secondary | ICD-10-CM | POA: Diagnosis not present

## 2024-05-31 DIAGNOSIS — D0472 Carcinoma in situ of skin of left lower limb, including hip: Secondary | ICD-10-CM | POA: Diagnosis not present

## 2024-05-31 DIAGNOSIS — D099 Carcinoma in situ, unspecified: Secondary | ICD-10-CM

## 2024-05-31 DIAGNOSIS — L905 Scar conditions and fibrosis of skin: Secondary | ICD-10-CM | POA: Diagnosis not present

## 2024-05-31 DIAGNOSIS — W908XXA Exposure to other nonionizing radiation, initial encounter: Secondary | ICD-10-CM

## 2024-05-31 DIAGNOSIS — D1801 Hemangioma of skin and subcutaneous tissue: Secondary | ICD-10-CM

## 2024-05-31 DIAGNOSIS — L570 Actinic keratosis: Secondary | ICD-10-CM | POA: Diagnosis not present

## 2024-05-31 DIAGNOSIS — D485 Neoplasm of uncertain behavior of skin: Secondary | ICD-10-CM | POA: Diagnosis not present

## 2024-05-31 DIAGNOSIS — Z85828 Personal history of other malignant neoplasm of skin: Secondary | ICD-10-CM

## 2024-05-31 DIAGNOSIS — C4492 Squamous cell carcinoma of skin, unspecified: Secondary | ICD-10-CM

## 2024-05-31 DIAGNOSIS — L821 Other seborrheic keratosis: Secondary | ICD-10-CM | POA: Diagnosis not present

## 2024-05-31 DIAGNOSIS — D229 Melanocytic nevi, unspecified: Secondary | ICD-10-CM

## 2024-05-31 HISTORY — DX: Carcinoma in situ, unspecified: D09.9

## 2024-05-31 MED ORDER — TRIAMCINOLONE ACETONIDE 0.1 % EX OINT
1.0000 | TOPICAL_OINTMENT | Freq: Every day | CUTANEOUS | 0 refills | Status: AC | PRN
  Filled 2024-05-31: qty 80, 30d supply, fill #0

## 2024-05-31 MED ORDER — METFORMIN HCL 1000 MG PO TABS
1000.0000 mg | ORAL_TABLET | Freq: Two times a day (BID) | ORAL | 1 refills | Status: AC
  Filled 2024-05-31: qty 180, 90d supply, fill #0
  Filled 2024-08-31: qty 180, 90d supply, fill #1

## 2024-05-31 NOTE — Progress Notes (Addendum)
 Follow-Up Visit   Subjective  Seth Pineda is a 63 y.o. male who presents for the following: Skin Cancer Screening and Full Body Skin Exam  The patient presents for Total-Body Skin Exam (TBSE) for skin cancer screening and mole check. The patient has spots, moles and lesions to be evaluated, some may be new or changing.  He is also s/p Mohs for a SCC on the right anterior neck, treated on 04/05/24, repaired with linear closure.  The following portions of the chart were reviewed this encounter and updated as appropriate: medications, allergies, medical history  Review of Systems:  No other skin or systemic complaints except as noted in HPI or Assessment and Plan.  Objective  Well appearing patient in no apparent distress; mood and affect are within normal limits.  A full examination was performed including scalp, head, eyes, ears, nose, lips, neck, chest, axillae, abdomen, back, buttocks, bilateral upper extremities, bilateral lower extremities, hands, feet, fingers, toes, fingernails, and toenails. All findings within normal limits unless otherwise noted below.   Relevant physical exam findings are noted in the Assessment and Plan.  Left Lower Leg - Posterior 1.2 x 1.2 cm crusted plaque  Left Zygomatic Area, Right Parotid Area (2) Erythematous thin papules/macules with gritty scale.   Assessment & Plan   SKIN CANCER SCREENING PERFORMED TODAY.  ACTINIC DAMAGE - Chronic condition, secondary to cumulative UV/sun exposure - diffuse scaly erythematous macules with underlying dyspigmentation - Recommend daily broad spectrum sunscreen SPF 30+ to sun-exposed areas, reapply every 2 hours as needed.  - Staying in the shade or wearing long sleeves, sun glasses (UVA+UVB protection) and wide brim hats (4-inch brim around the entire circumference of the hat) are also recommended for sun protection.  - Call for new or changing lesions.  MELANOCYTIC NEVI - Tan-brown and/or  pink-flesh-colored symmetric macules and papules - Benign appearing on exam today - Observation - Call clinic for new or changing moles - Recommend daily use of broad spectrum spf 30+ sunscreen to sun-exposed areas.   LENTIGINES Exam: scattered tan macules Due to sun exposure Treatment Plan: Benign-appearing, observe. Recommend daily broad spectrum sunscreen SPF 30+ to sun-exposed areas, reapply every 2 hours as needed.  Call for any changes   HEMANGIOMA Exam: red papule(s) Discussed benign nature. Recommend observation. Call for changes.   SEBORRHEIC KERATOSIS - Stuck-on, waxy, tan-brown papules and/or plaques  - Benign-appearing - Discussed benign etiology and prognosis. - Observe - Call for any changes   HISTORY OF SQUAMOUS CELL CARCINOMA OF THE SKIN - No evidence of recurrence today - No lymphadenopathy - Recommend regular full body skin exams - Recommend daily broad spectrum sunscreen SPF 30+ to sun-exposed areas, reapply every 2 hours as needed.  - Call if any new or changing lesions are noted between office visits  Scar s/p Mohs for Seth Pineda on the right neck, treated on 04/05/2024, repaired with linear closure - Reassured that wound has healed well - Discussed that scars take up to 12 months to mature from the date of surgery - Recommend SPF 30+ to scar daily to prevent purple color - OK to start scar massage at 4-6 weeks post-op - Can consider silicone based products for scar healing  NEOPLASM OF UNCERTAIN BEHAVIOR OF SKIN Left Lower Leg - Posterior Skin / nail biopsy Type of biopsy: tangential   Informed consent: discussed and consent obtained   Timeout: patient name, date of birth, surgical site, and procedure verified   Procedure prep:  Patient was prepped  and draped in usual sterile fashion Prep type:  Isopropyl alcohol Anesthesia: the lesion was anesthetized in a standard fashion   Anesthetic:  1% lidocaine w/ epinephrine 1-100,000 buffered w/ 8.4%  NaHCO3 Instrument used: DermaBlade   Hemostasis achieved with: aluminum chloride   Outcome: patient tolerated procedure well   Post-procedure details: sterile dressing applied and wound care instructions given   Dressing type: pressure dressing and bandage    Specimen 1 - Surgical pathology Differential Diagnosis: R/O NMSC  Check Margins: No AK (ACTINIC KERATOSIS) (3) Left Zygomatic Area, Right Parotid Area (2) Destruction of lesion - Left Zygomatic Area, Right Parotid Area (2) Complexity: simple   Destruction method: cryotherapy   Informed consent: discussed and consent obtained   Timeout:  patient name, date of birth, surgical site, and procedure verified Outcome: patient tolerated procedure well with no complications   Post-procedure details: sterile dressing applied and wound care instructions given   Dressing type: bandage and pressure dressing    SQUAMOUS CELL CARCINOMA OF SKIN   Related Medications oxyCODONE  (OXY IR/ROXICODONE ) 5 MG immediate release tablet Take 1 tablet (5 mg total) by mouth every 6 (six) hours as needed for up to 8 doses. SCAR   ACTINIC SKIN DAMAGE   MULTIPLE BENIGN NEVI   LENTIGINES   CHERRY ANGIOMA   Return in about 6 months (around 11/28/2024) for TBSE.  I, Darice Smock, CMA, am acting as scribe for RUFUS CHRISTELLA HOLY, MD.   Documentation: I have reviewed the above documentation for accuracy and completeness, and I agree with the above.  RUFUS CHRISTELLA HOLY, MD

## 2024-05-31 NOTE — Patient Instructions (Addendum)
Cryotherapy Aftercare  Wash gently with soap and water everyday.   Apply Vaseline and Band-Aid daily until healed.   Patient Handout: Wound Care for Skin Biopsy Site  Taking Care of Your Skin Biopsy Site  Proper care of the biopsy site is essential for promoting healing and minimizing scarring. This handout provides instructions on how to care for your biopsy site to ensure optimal recovery.  1. Cleaning the Wound:  Clean the biopsy site daily with gentle soap and water. Gently pat the area dry with a clean, soft towel. Avoid harsh scrubbing or rubbing the area, as this can irritate the skin and delay healing.  2. Applying Aquaphor and Bandage:  After cleaning the wound, apply a thin layer of Aquaphor ointment to the biopsy site. Cover the area with a sterile bandage to protect it from dirt, bacteria, and friction. Change the bandage daily or as needed if it becomes soiled or wet.  3. Continued Care for One Week:  Repeat the cleaning, Aquaphor application, and bandaging process daily for one week following the biopsy procedure. Keeping the wound clean and moist during this initial healing period will help prevent infection and promote optimal healing.  4. Massaging Aquaphor into the Area:  ---After one week, discontinue the use of bandages but continue to apply Aquaphor to the biopsy site. ----Gently massage the Aquaphor into the area using circular motions. ---Massaging the skin helps to promote circulation and prevent the formation of scar tissue.   Additional Tips:  Avoid exposing the biopsy site to direct sunlight during the healing process, as this can cause hyperpigmentation or worsen scarring. If you experience any signs of infection, such as increased redness, swelling, warmth, or drainage from the wound, contact your healthcare provider immediately. Follow any additional instructions provided by your healthcare provider for caring for the biopsy site and managing any  discomfort. Conclusion:  Taking proper care of your skin biopsy site is crucial for ensuring optimal healing and minimizing scarring. By following these instructions for cleaning, applying Aquaphor, and massaging the area, you can promote a smooth and successful recovery. If you have any questions or concerns about caring for your biopsy site, don't hesitate to contact your healthcare provider for guidance.    Skin Education : We counseled the patient regarding the following: Sun screen (SPF 30 or greater) should be applied during peak UV exposure (between 10am and 2pm) and reapplied after exercise or swimming.  The ABCDEs of melanoma were reviewed with the patient, and the importance of monthly self-examination of moles was emphasized. Should any moles change in shape or color, or itch, bleed or burn, pt will contact our office for evaluation sooner then their interval appointment.  Plan: Sunscreen Recommendations We recommended a broad spectrum sunscreen with a SPF of 30 or higher.  SPF 30 sunscreens block approximately 97 percent of the sun's harmful rays. Sunscreens should be applied at least 15 minutes prior to expected sun exposure and then every 2 hours after that as long as sun exposure continues. If swimming or exercising sunscreen should be reapplied every 45 minutes to an hour after getting wet or sweating. One ounce, or the equivalent of a shot glass full of sunscreen, is adequate to protect the skin not covered by a bathing suit. We also recommended a lip balm with a sunscreen as well. Sun protective clothing can be used in lieu of sunscreen but must be worn the entire time you are exposed to the sun's rays. Important Information  Due to recent changes in healthcare laws, you may see results of your pathology and/or laboratory studies on MyChart before the doctors have had a chance to review them. We understand that in some cases there may be results that are confusing or concerning to  you. Please understand that not all results are received at the same time and often the doctors may need to interpret multiple results in order to provide you with the best plan of care or course of treatment. Therefore, we ask that you please give [place] 2 business days to thoroughly review all your results before contacting the office for clarification. Should we see a critical lab result, you will be contacted sooner.     If You Need Anything After Your Visit   If you have any questions or concerns for your doctor, please call our main line at [phone]. If no one answers, please leave a voicemail as directed and we will return your call as soon as possible. Messages left after 4 pm will be answered the following business day.    You may also send [place] a message via MyChart. We typically respond to MyChart messages within 1-2 business days.  For prescription refills, please ask your pharmacy to contact our office. Our fax number is [phone].  If you have an urgent issue when the clinic is closed that cannot wait until the next business day, you can page your doctor at the number below.     Please note that while we do our best to be available for urgent issues outside of office hours, we are not available 24/7.    If you have an urgent issue and are unable to reach [place], you may choose to seek medical care at your doctor's office, retail clinic, urgent care center, or emergency room.   If you have a medical emergency, please immediately call 911 or go to the emergency department. In the event of inclement weather, please call our main line at [phone] for an update on the status of any delays or closures.  Dermatology Medication Tips: Please keep the boxes that topical medications come in in order to help keep track of the instructions about where and how to use these. Pharmacies typically print the medication instructions only on the boxes and not directly on the medication tubes.   If  your medication is too expensive, please contact our office at [phone] or send [place] a message through MyChart.    We are unable to tell what your co-pay for medications will be in advance as this is different depending on your insurance coverage. However, we may be able to find a substitute medication at lower cost or fill out paperwork to get insurance to cover a needed medication.    If a prior authorization is required to get your medication covered by your insurance company, please allow [place] 1-2 business days to complete this process.   Drug prices often vary depending on where the prescription is filled and some pharmacies may offer cheaper prices.   The website www.goodrx.com contains coupons for medications through different pharmacies. The prices here do not account for what the cost may be with help from insurance (it may be cheaper with your insurance), but the website can give you the price if you did not use any insurance.  - You can print the associated coupon and take it with your prescription to the pharmacy.  - You may also stop by our office during regular business hours and  pick up a GoodRx coupon card.  - If you need your prescription sent electronically to a different pharmacy, notify our office through [facility] or by phone at [phone]

## 2024-06-01 LAB — SURGICAL PATHOLOGY

## 2024-06-02 ENCOUNTER — Ambulatory Visit: Payer: Self-pay | Admitting: Dermatology

## 2024-06-03 NOTE — Progress Notes (Signed)
 Spoke w pt gave him bx results and he decided to do ed&c

## 2024-06-23 ENCOUNTER — Other Ambulatory Visit (HOSPITAL_BASED_OUTPATIENT_CLINIC_OR_DEPARTMENT_OTHER): Payer: Self-pay

## 2024-06-23 ENCOUNTER — Other Ambulatory Visit: Payer: Self-pay | Admitting: Internal Medicine

## 2024-06-23 ENCOUNTER — Other Ambulatory Visit: Payer: Self-pay

## 2024-06-23 MED ORDER — FENOFIBRATE 160 MG PO TABS
160.0000 mg | ORAL_TABLET | Freq: Every day | ORAL | 1 refills | Status: AC
  Filled 2024-06-23: qty 90, 90d supply, fill #0

## 2024-08-10 ENCOUNTER — Ambulatory Visit: Admitting: Internal Medicine

## 2024-08-17 ENCOUNTER — Telehealth: Admitting: Physician Assistant

## 2024-08-17 ENCOUNTER — Ambulatory Visit: Admitting: Dermatology

## 2024-08-17 ENCOUNTER — Other Ambulatory Visit (HOSPITAL_BASED_OUTPATIENT_CLINIC_OR_DEPARTMENT_OTHER): Payer: Self-pay

## 2024-08-17 ENCOUNTER — Encounter: Payer: Self-pay | Admitting: Dermatology

## 2024-08-17 VITALS — BP 132/89 | HR 81 | Temp 97.7°F

## 2024-08-17 DIAGNOSIS — M546 Pain in thoracic spine: Secondary | ICD-10-CM | POA: Diagnosis not present

## 2024-08-17 DIAGNOSIS — C44729 Squamous cell carcinoma of skin of left lower limb, including hip: Secondary | ICD-10-CM | POA: Diagnosis not present

## 2024-08-17 DIAGNOSIS — C4492 Squamous cell carcinoma of skin, unspecified: Secondary | ICD-10-CM

## 2024-08-17 MED ORDER — MUPIROCIN 2 % EX OINT
1.0000 | TOPICAL_OINTMENT | Freq: Two times a day (BID) | CUTANEOUS | 1 refills | Status: AC
  Filled 2024-08-17: qty 22, 11d supply, fill #0

## 2024-08-17 MED ORDER — CYCLOBENZAPRINE HCL 10 MG PO TABS
10.0000 mg | ORAL_TABLET | Freq: Three times a day (TID) | ORAL | 0 refills | Status: AC | PRN
  Filled 2024-08-17 (×2): qty 15, 5d supply, fill #0

## 2024-08-17 MED ORDER — ETODOLAC 300 MG PO CAPS
300.0000 mg | ORAL_CAPSULE | Freq: Two times a day (BID) | ORAL | 0 refills | Status: DC
  Filled 2024-08-17: qty 20, 10d supply, fill #0

## 2024-08-17 NOTE — Progress Notes (Signed)
 We are sorry that you are not feeling well.  Here is how we plan to help!  Based on what you have shared with me it looks like you mostly have acute back pain.  Acute back pain is defined as musculoskeletal pain that can resolve in 1-3 weeks with conservative treatment.  I have prescribed Etodolac  300 mg take one by mouth twice a day non-steroid anti-inflammatory (NSAID) as well as Flexeril  10 mg every eight hours as needed which is a muscle relaxer  Some patients experience stomach irritation or in increased heartburn with anti-inflammatory drugs.  Please keep in mind that muscle relaxer's can cause fatigue and should not be taken while at work or driving.  Back pain is very common.  The pain often gets better over time.  The cause of back pain is usually not dangerous.  Most people can learn to manage their back pain on their own.  Home Care Stay active.  Start with short walks on flat ground if you can.  Try to walk farther each day. Do not sit, drive or stand in one place for more than 30 minutes.  Do not stay in bed. Do not avoid exercise or work.  Activity can help your back heal faster. Be careful when you bend or lift an object.  Bend at your knees, keep the object close to you, and do not twist. Sleep on a firm mattress.  Lie on your side, and bend your knees.  If you lie on your back, put a pillow under your knees. Only take medicines as told by your doctor. Put ice on the injured area. Put ice in a plastic bag Place a towel between your skin and the bag Leave the ice on for 15-20 minutes, 3-4 times a day for the first 2-3 days. 210 After that, you can switch between ice and heat packs. Ask your doctor about back exercises or massage. Avoid feeling anxious or stressed.  Find good ways to deal with stress, such as exercise.  Get Help Right Way If: Your pain does not go away with rest or medicine. Your pain does not go away in 1 week. You have new problems. You do not feel  well. The pain spreads into your legs. You cannot control when you poop (bowel movement) or pee (urinate) You feel sick to your stomach (nauseous) or throw up (vomit) You have belly (abdominal) pain. You feel like you may pass out (faint). If you develop a fever.  Make Sure you: Understand these instructions. Will watch your condition Will get help right away if you are not doing well or get worse.  Your e-visit answers were reviewed by a board certified advanced clinical practitioner to complete your personal care plan.  Depending on the condition, your plan could have included both over the counter or prescription medications.  If there is a problem please reply  once you have received a response from your provider.  Your safety is important to us .  If you have drug allergies check your prescription carefully.    You can use MyChart to ask questions about todays visit, request a non-urgent call back, or ask for a work or school excuse for 24 hours related to this e-Visit. If it has been greater than 24 hours you will need to follow up with your provider, or enter a new e-Visit to address those concerns.  You will get an e-mail in the next two days asking about your experience.  I hope that  your e-visit has been valuable and will speed your recovery. Thank you for using e-visits.   I have spent 5 minutes in review of e-visit questionnaire, review and updating patient chart, medical decision making and response to patient.   Elsie Velma Lunger, PA-C

## 2024-08-17 NOTE — Progress Notes (Signed)
° °  Follow-Up Visit   Subjective  Seth Pineda is a 63 y.o. 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 Lesion was biopsied by Dr. Corey on 05/27/2024.   Excision vs ED&C Treatment options were discussed with the patient. The patient decided to proceed with ED&C.   The following portions of the chart were reviewed this encounter and updated as appropriate: medications, allergies, medical history  Pt reports lesion has been present for several months. Pt denies pain and itchiness.   Review of Systems:  No other skin or systemic complaints except as noted in HPI or Assessment and Plan.  Objective  Well appearing patient in no apparent distress; mood and affect are within normal limits.  A focused examination was performed of the following areas: Left lower leg posterior Relevant physical exam findings are noted in the Assessment and Plan.   Left Lower Leg - Posterior Biopsy scar  Assessment & Plan  SQUAMOUS CELL CARCINOMA OF SKIN Left Lower Leg - Posterior - Destruction of lesion Complexity: simple   Destruction method: electrodesiccation and curettage   Timeout:  patient name, date of birth, surgical site, and procedure verified Procedure prep:  Patient was prepped and draped in usual sterile fashion Prep type:  Chlorhexidine  Anesthesia: the lesion was anesthetized in a standard fashion   Anesthetic:  1% lidocaine w/ epinephrine 1-100,000 buffered w/ 8.4% NaHCO3 Curettage performed in three different directions: Yes   Electrodesiccation performed over the curetted area: Yes   Curettage cycles:  3 Lesion length (cm):  1.3 Lesion width (cm):  1.1 Margin per side (cm):  0.2 Final wound size (cm):  1.7 Hemostasis achieved with:  electrodesiccation Outcome: patient tolerated procedure well with no complications   Post-procedure details: sterile dressing applied and wound care instructions given   Dressing type:  bandage and pressure dressing      Return for next skin exam.  I, Rollene Gobble, RN, am acting as scribe for RUFUS CHRISTELLA COREY, MD .   Documentation: I have reviewed the above documentation for accuracy and completeness, and I agree with the above.  RUFUS CHRISTELLA COREY, MD

## 2024-08-17 NOTE — Patient Instructions (Addendum)
 .ED Important Information   Due to recent changes in healthcare laws, you may see results of your pathology and/or laboratory studies on MyChart before the doctors have had a chance to review them. We understand that in some cases there may be results that are confusing or concerning to you. Please understand that not all results are received at the same time and often the doctors may need to interpret multiple results in order to provide you with the best plan of care or course of treatment. Therefore, we ask that you please give us  2 business days to thoroughly review all your results before contacting the office for clarification. Should we see a critical lab result, you will be contacted sooner.     If You Need Anything After Your Visit   If you have any questions or concerns for your doctor, please call our main line at [phone]. If no one answers, please leave a voicemail as directed and we will return your call as soon as possible. Messages left after 4 pm will be answered the following business day.    You may also send us  a message via MyChart. We typically respond to MyChart messages within 1-2 business days.  For prescription refills, please ask your pharmacy to contact our office. Our fax number is [phone].  If you have an urgent issue when the clinic is closed that cannot wait until the next business day, you can page your doctor at the number below.     Please note that while we do our best to be available for urgent issues outside of office hours, we are not available 24/7.    If you have an urgent issue and are unable to reach us , you may choose to seek medical care at your doctor's office, retail clinic, urgent care center, or emergency room.   If you have a medical emergency, please immediately call 911 or go to the emergency department. In the event of inclement weather, please call our main line at [phone] for an update on the status of any delays or  closures.  Dermatology Medication Tips: Please keep the boxes that topical medications come in in order to help keep track of the instructions about where and how to use these. Pharmacies typically print the medication instructions only on the boxes and not directly on the medication tubes.   If your medication is too expensive, please contact our office at [phone] or send us  a message through MyChart.    We are unable to tell what your co-pay for medications will be in advance as this is different depending on your insurance coverage. However, we may be able to find a substitute medication at lower cost or fill out paperwork to get insurance to cover a needed medication.    If a prior authorization is required to get your medication covered by your insurance company, please allow us  1-2 business days to complete this process.   Drug prices often vary depending on where the prescription is filled and some pharmacies may offer cheaper prices.   The website www.goodrx.com contains coupons for medications through different pharmacies. The prices here do not account for what the cost may be with help from insurance (it may be cheaper with your insurance), but the website can give you the price if you did not use any insurance.  - You can print the associated coupon and take it with your prescription to the pharmacy.  - You may also stop by our office during  regular business hours and pick up a GoodRx coupon card.  - If you need your prescription sent electronically to a different pharmacy, notify our office through [facility] or by phone at [phone]

## 2024-08-31 ENCOUNTER — Other Ambulatory Visit: Payer: Self-pay | Admitting: Internal Medicine

## 2024-08-31 ENCOUNTER — Other Ambulatory Visit (HOSPITAL_BASED_OUTPATIENT_CLINIC_OR_DEPARTMENT_OTHER): Payer: Self-pay

## 2024-08-31 MED ORDER — EZETIMIBE 10 MG PO TABS
10.0000 mg | ORAL_TABLET | Freq: Every day | ORAL | 1 refills | Status: AC
  Filled 2024-08-31: qty 90, 90d supply, fill #0

## 2024-09-01 ENCOUNTER — Other Ambulatory Visit (HOSPITAL_BASED_OUTPATIENT_CLINIC_OR_DEPARTMENT_OTHER): Payer: Self-pay

## 2024-09-01 MED ORDER — FLUZONE HIGH-DOSE 0.5 ML IM SUSY
0.5000 mL | PREFILLED_SYRINGE | Freq: Once | INTRAMUSCULAR | 0 refills | Status: AC
  Filled 2024-09-01: qty 0.5, 1d supply, fill #0

## 2024-09-27 ENCOUNTER — Ambulatory Visit: Admitting: Internal Medicine

## 2024-09-28 ENCOUNTER — Ambulatory Visit: Admitting: Internal Medicine

## 2024-09-28 VITALS — BP 124/63 | HR 82 | Temp 97.4°F | Resp 16 | Ht >= 80 in | Wt 235.0 lb

## 2024-09-28 DIAGNOSIS — Z7984 Long term (current) use of oral hypoglycemic drugs: Secondary | ICD-10-CM | POA: Diagnosis not present

## 2024-09-28 DIAGNOSIS — K219 Gastro-esophageal reflux disease without esophagitis: Secondary | ICD-10-CM

## 2024-09-28 DIAGNOSIS — I10 Essential (primary) hypertension: Secondary | ICD-10-CM | POA: Diagnosis not present

## 2024-09-28 DIAGNOSIS — E119 Type 2 diabetes mellitus without complications: Secondary | ICD-10-CM | POA: Diagnosis not present

## 2024-09-28 DIAGNOSIS — E7800 Pure hypercholesterolemia, unspecified: Secondary | ICD-10-CM | POA: Diagnosis not present

## 2024-09-28 NOTE — Assessment & Plan Note (Signed)
 DM, no complications Blood sugars well-controlled, 112-134 mg/dL. No neuropathy symptoms. Adheres to diet and physical activity.  Feet exam negative. - Continue Metformin  and Pioglitazone .  Continue checking CBGs, check A1c Hyperlipidemia LDL cholesterol 83 mg/dL, target 70 mg/dL for cardiovascular risk reduction. Current levels good but could improve. - Ordered fasting lipid panel. - Continue Atorvastatin , Ezetimibe , and Fenofibrate . HTN Blood pressure well-controlled with current regimen.  On lifestyle treatment. Gastroesophageal reflux disease (GERD) On omeprazole . SCC Recent SCC removal on neck and back of leg. Healing well, no complications. General Health Maintenance Flu shot received. Recommend to consider a COVID booster and a shingles vaccine.  RTC June 2026 CPX.

## 2024-09-28 NOTE — Progress Notes (Signed)
 "  Subjective:    Patient ID: Seth Pineda, male    DOB: Jul 16, 1961, 64 y.o.   MRN: [id number]  DOS:  09/28/2024  Follow up  Discussed the use of AI scribe software for clinical note transcription with the patient, who gave verbal consent to proceed.  History of Present Illness Seth Pineda is a 64 year old male who presents for a routine follow-up visit.   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 - No numbness or tingling in feet  Medication tolerance and adherence - Takes aspirin, pioglitazone , metformin , atorvastatin , Zetia , fenofibrate , and omeprazole  - Uses topical creams as needed - Cyclobenzaprine  reserved for rare use      Review of Systems See above   Past Medical History:  Diagnosis Date   Diabetes mellitus without complication (HCC)    Essential hypertension, benign 01/03/2009   Qualifier: Diagnosis of  By: Krystal MD, Reyes A    GERD (gastroesophageal reflux disease)    no longer on Pepcid     Hyperlipidemia    Hypertension    Squamous cell carcinoma in situ (SCCIS) 05/31/2024   left lower leg- tx ED&C Dr. Corey 08/17/2024    Past Surgical History:  Procedure Laterality Date   NO PAST SURGERIES      Current Outpatient Medications  Medication Instructions   aspirin EC 81 mg, Daily   atorvastatin  (LIPITOR) 40 mg, Oral, Daily at bedtime   cyclobenzaprine  (FLEXERIL ) 10 mg, Oral, 3 times daily PRN   ezetimibe  (ZETIA ) 10 mg, Oral, Daily   fenofibrate  160 mg, Oral, Daily   metFORMIN  (GLUCOPHAGE ) 1,000 mg, Oral, 2 times daily with meals   Multiple Vitamin (MULTIVITAMIN) tablet 1 tablet,  2 times daily   mupirocin  ointment (BACTROBAN ) 2 % 1 Application, Topical, 2 times daily   omeprazole  (PRILOSEC) 40 mg, Oral, Daily before breakfast   pioglitazone  (ACTOS ) 30 mg, Oral, Daily   triamcinolone  ointment (KENALOG ) 0.1 % 1 Application, Topical, Daily PRN       Objective:   Physical Exam BP 124/63 (BP Location: Left Arm, Patient Position: Sitting, Cuff Size: Large)   Pulse 82   Temp (!) 97.4 F (36.3 C) (Oral)   Resp 16   Ht 6' 8 (2.032 m)   Wt 235 lb (106.6 kg)   SpO2 96%   BMI 25.82 kg/m  General:   Well developed, NAD, BMI noted. HEENT:  Normocephalic . Face symmetric, atraumatic Diabetes foot exam: No edema, pinprick examination normal. Skin: Not pale. Not jaundice Neurologic:  alert & oriented X3.  Speech normal, gait appropriate for age and unassisted Psych--  Cognition and judgment appear intact.  Cooperative with normal attention span and concentration.  Behavior appropriate. No anxious or depressed appearing.      Assessment     ASSESSMENT DM A1c 6.5 (9-2 20) HTN dx in his 38 Dyslipidemia Back pain  GERD: EGD 10-2017, severe ulcerative esophagitis.  BX negative for H. pylori or dysplasia. E.D. SCC- per derm, dx 2025 h/o low testosterone     Assessment & Plan DM, no complications Blood sugars well-controlled, 112-134 mg/dL. No neuropathy symptoms. Adheres to diet and physical  activity.  Feet exam negative. - Continue Metformin  and Pioglitazone .  Continue checking CBGs, check A1c Hyperlipidemia LDL cholesterol 83 mg/dL, target 70 mg/dL for cardiovascular risk reduction. Current levels good but could improve. - Ordered fasting lipid panel. - Continue Atorvastatin , Ezetimibe , and Fenofibrate . HTN Blood pressure well-controlled with current regimen.  On lifestyle treatment. Gastroesophageal reflux disease (GERD) On omeprazole . SCC Recent SCC removal on neck and back of leg. Healing well, no complications. General Health Maintenance Flu  shot received. Recommend to consider a COVID booster and a shingles vaccine.  RTC June 2026 CPX.       "

## 2024-09-28 NOTE — Patient Instructions (Addendum)
 Please read your instructions carefully.   Go to the front desk for the checkout Please make an appointment  for a fasting blood test this week Schedule a physical exam for 01/2025      Consider a Covid booster You can get a shingles shot at the pharmacy  Diabetes  You can check your sugars at different times  - early in AM fasting  ( blood sugar goal 70-130) - 2 hours after a meal (blood sugar goal less than 180)

## 2024-09-29 ENCOUNTER — Other Ambulatory Visit (INDEPENDENT_AMBULATORY_CARE_PROVIDER_SITE_OTHER)

## 2024-09-29 DIAGNOSIS — E7800 Pure hypercholesterolemia, unspecified: Secondary | ICD-10-CM | POA: Diagnosis not present

## 2024-09-29 DIAGNOSIS — E119 Type 2 diabetes mellitus without complications: Secondary | ICD-10-CM

## 2024-09-29 LAB — LIPID PANEL
Cholesterol: 145 mg/dL (ref 28–200)
HDL: 29.8 mg/dL — ABNORMAL LOW
LDL Cholesterol: 66 mg/dL (ref 10–99)
NonHDL: 114.8
Total CHOL/HDL Ratio: 5
Triglycerides: 243 mg/dL — ABNORMAL HIGH (ref 10.0–149.0)
VLDL: 48.6 mg/dL — ABNORMAL HIGH (ref 0.0–40.0)

## 2024-09-29 LAB — BASIC METABOLIC PANEL WITH GFR
BUN: 14 mg/dL (ref 6–23)
CO2: 29 meq/L (ref 19–32)
Calcium: 9.7 mg/dL (ref 8.4–10.5)
Chloride: 104 meq/L (ref 96–112)
Creatinine, Ser: 0.9 mg/dL (ref 0.40–1.50)
GFR: 89.51 mL/min
Glucose, Bld: 118 mg/dL — ABNORMAL HIGH (ref 70–99)
Potassium: 4.3 meq/L (ref 3.5–5.1)
Sodium: 140 meq/L (ref 135–145)

## 2024-09-29 LAB — HEMOGLOBIN A1C: Hgb A1c MFr Bld: 6.4 % (ref 4.6–6.5)

## 2024-09-30 ENCOUNTER — Ambulatory Visit: Payer: Self-pay | Admitting: Internal Medicine

## 2024-10-25 ENCOUNTER — Ambulatory Visit: Admitting: Internal Medicine

## 2024-12-06 ENCOUNTER — Ambulatory Visit: Admitting: Dermatology

## 2025-03-29 ENCOUNTER — Encounter: Admitting: Internal Medicine
